# Patient Record
Sex: Male | Born: 1960 | Race: Black or African American | Hispanic: No | Marital: Single | Smoking: Current every day smoker
Health system: Southern US, Community
[De-identification: ages and names within clinical notes are randomized; demographics above are authoritative.]

## PROBLEM LIST (undated history)

## (undated) DIAGNOSIS — B1920 Unspecified viral hepatitis C without hepatic coma: Secondary | ICD-10-CM

## (undated) DIAGNOSIS — K219 Gastro-esophageal reflux disease without esophagitis: Secondary | ICD-10-CM

## (undated) DIAGNOSIS — F419 Anxiety disorder, unspecified: Secondary | ICD-10-CM

## (undated) DIAGNOSIS — F319 Bipolar disorder, unspecified: Secondary | ICD-10-CM

## (undated) DIAGNOSIS — F32A Depression, unspecified: Secondary | ICD-10-CM

## (undated) DIAGNOSIS — I10 Essential (primary) hypertension: Secondary | ICD-10-CM

## (undated) DIAGNOSIS — F209 Schizophrenia, unspecified: Secondary | ICD-10-CM

## (undated) DIAGNOSIS — F329 Major depressive disorder, single episode, unspecified: Secondary | ICD-10-CM

## (undated) DIAGNOSIS — M1990 Unspecified osteoarthritis, unspecified site: Secondary | ICD-10-CM

## (undated) HISTORY — PX: BACK SURGERY: SHX140

## (undated) HISTORY — PX: COLONOSCOPY: SHX174

---

## 2016-09-24 HISTORY — PX: JOINT REPLACEMENT: SHX530

## 2018-06-01 ENCOUNTER — Encounter (HOSPITAL_COMMUNITY): Payer: Self-pay | Admitting: Emergency Medicine

## 2018-06-01 ENCOUNTER — Emergency Department (HOSPITAL_COMMUNITY): Payer: Medicaid Other

## 2018-06-01 ENCOUNTER — Emergency Department (HOSPITAL_COMMUNITY)
Admission: EM | Admit: 2018-06-01 | Discharge: 2018-06-01 | Disposition: A | Discharge status: Home / Self Care | Payer: Medicaid Other | Attending: Emergency Medicine | Admitting: Emergency Medicine

## 2018-06-01 ENCOUNTER — Other Ambulatory Visit: Payer: Self-pay

## 2018-06-01 DIAGNOSIS — I10 Essential (primary) hypertension: Secondary | ICD-10-CM | POA: Insufficient documentation

## 2018-06-01 DIAGNOSIS — G8929 Other chronic pain: Secondary | ICD-10-CM | POA: Insufficient documentation

## 2018-06-01 DIAGNOSIS — M25562 Pain in left knee: Secondary | ICD-10-CM | POA: Insufficient documentation

## 2018-06-01 DIAGNOSIS — F17210 Nicotine dependence, cigarettes, uncomplicated: Secondary | ICD-10-CM | POA: Insufficient documentation

## 2018-06-01 HISTORY — DX: Depression, unspecified: F32.A

## 2018-06-01 HISTORY — DX: Essential (primary) hypertension: I10

## 2018-06-01 HISTORY — DX: Major depressive disorder, single episode, unspecified: F32.9

## 2018-06-01 LAB — CBC
HCT: 43.1 % (ref 39.0–52.0)
HEMOGLOBIN: 14.4 g/dL (ref 13.0–17.0)
MCH: 32.4 pg (ref 26.0–34.0)
MCHC: 33.4 g/dL (ref 30.0–36.0)
MCV: 96.9 fL (ref 78.0–100.0)
Platelets: 138 10*3/uL — ABNORMAL LOW (ref 150–400)
RBC: 4.45 MIL/uL (ref 4.22–5.81)
RDW: 13.3 % (ref 11.5–15.5)
WBC: 5.1 10*3/uL (ref 4.0–10.5)

## 2018-06-01 LAB — C-REACTIVE PROTEIN

## 2018-06-01 LAB — SEDIMENTATION RATE: Sed Rate: 8 mm/hr (ref 0–16)

## 2018-06-01 NOTE — Discharge Instructions (Signed)
It was my pleasure taking care of you today!   Fortunately, your blood work was normal today.  You should still call your orthopedic doctor and schedule a follow up appointment.  Return to ER for fever, new or worsening symptoms, any additional concerns.

## 2018-06-01 NOTE — ED Triage Notes (Signed)
Pt. Stated, I had knee surgery a year ago and its still not right. My Dr said I probably have something loose.

## 2018-06-01 NOTE — ED Provider Notes (Signed)
[facility] EMERGENCY DEPARTMENT Provider Note   CSN: [id number] Arrival date & time: 06/01/18  1191     History   Chief Complaint Chief Complaint  Patient presents with  . Knee Pain    HPI Donald Jacobson is a 57 y.o. male.  The history is provided by the patient and medical records. No language interpreter was used.  Knee Pain   Pertinent negatives include no numbness.   Donald Jacobson is a 57 y.o. male  with a PMH of HTN who presents to the Emergency Department complaining of persistent pain to his left knee ever since a total knee replacement about a year ago through the [place].  He states that the knee has been swollen and painful since surgery.  About 2 months ago, he also felt it was getting warm to the touch.  He saw his orthopedist about 1 month ago where x-ray was done and he was told to follow-up in about a week.  He has been unable to do so due to transportation difficulties.  He called the orthopedist 1 week ago and was told to come to the emergency department if he could not get transportation to the office.  No acute worsening in his pain over the last couple of weeks.  He denies any systemic symptoms including fever/chills.  Pain is worse with movement of the knee and ambulation.  Denies alleviating factors.  Past Medical History:  Diagnosis Date  . Depression   . Hypertension     There are no active problems to display for this patient.   History reviewed. No pertinent surgical history.      Home Medications    Prior to Admission medications   Not on File    Family History No family history on file.  Social History Social History   Tobacco Use  . Smoking status: Current Every Day Smoker  . Smokeless tobacco: Current User  Substance Use Topics  . Alcohol use: Yes  . Drug use: Yes    Types: Marijuana     Allergies   Patient has no allergy information on record.   Review of Systems Review of Systems  Constitutional:  Negative for chills and fever.  Musculoskeletal: Positive for arthralgias, joint swelling and myalgias.  Skin: Negative for color change.  Neurological: Negative for weakness and numbness.     Physical Exam Updated Vital Signs BP (!) 135/94 (BP Location: Right Arm)   Pulse 75   Temp 97.6 F (36.4 C) (Oral)   Resp 18   Ht 5\' 11"  (1.803 m)   Wt 83.5 kg   SpO2 100%   BMI 25.66 kg/m   Physical Exam  Constitutional: He appears well-developed and well-nourished. No distress.  HENT:  Head: Normocephalic and atraumatic.  Neck: Neck supple.  Cardiovascular: Normal rate, regular rhythm and normal heart sounds.  No murmur heard. Pulmonary/Chest: Effort normal and breath sounds normal. No respiratory distress. He has no wheezes. He has no rales.  Musculoskeletal:  Tenderness to palpation of medial and lateral aspects of the knee.  Decreased range of motion which patient states is baseline since knee replacement. + Swelling.  No erythema overlying the joint, however is slightly warm in comparison with the right.  Patient states that this warmth has been present for several months now. 2+ DP pulses bilaterally. All compartments are soft. Sensation intact distal to injury.  Neurological: He is alert.  Skin: Skin is warm and dry.  Nursing note and vitals reviewed.  ED Treatments / Results  Labs (all labs ordered are listed, but only abnormal results are displayed) Labs Reviewed  CBC - Abnormal; Notable for the following components:      Result Value   Platelets 138 (*)    All other components within normal limits  SEDIMENTATION RATE  C-REACTIVE PROTEIN    EKG None  Radiology Dg Knee Complete 4 Views Left  Result Date: 06/01/2018 CLINICAL DATA:  Chronic left knee pain EXAM: LEFT KNEE - COMPLETE 4+ VIEW COMPARISON:  None. FINDINGS: Total knee prosthesis observed with expected positioning of the prosthesis components and without fracture or evidence of loosening of components.  Palmar tray component appears appropriately located. There is some soft tissue swelling in the subcutaneous tissues along the anterior knee. Indistinctness of tissue planes along the suprapatellar bursa and in the expected vicinity of Hoffa's fat pad. IMPRESSION: 1. No specific abnormality associated with the prosthesis is identified. 2. However, there is indistinctness of tissue planes along the suprapatellar bursa and effacement of Hoffa's fat pad. I cannot exclude synovitis or joint effusion. There is also some subcutaneous edema anteriorly along the knee. Electronically Signed   By: Gaylyn Rong M.D.   On: 06/01/2018 09:19    Procedures Procedures (including critical care time)  Medications Ordered in ED Medications - No data to display   Initial Impression / Assessment and Plan / ED Course  I have reviewed the triage vital signs and the nursing notes.  Pertinent labs & imaging results that were available during my care of the patient were reviewed by me and considered in my medical decision making (see chart for details).    Donald Jacobson is a 57 y.o. male who presents to ED for persistent pain and swelling to the left knee since total knee replacement about a year ago through the [place].  He began having some warmth to the knee couple of months ago, therefore he sought the [place] orthopedist last month.  An x-ray was done and he was told to follow-up for further recommendations.  He has been unable to follow-up due to transportation issues.  Today, patient is afebrile, hemodynamically stable with normal inflammatory markers and white count.  He does have some warmth and swelling to the knee, but no erythema. Evaluation does not show pathology that would require ongoing emergent intervention or inpatient treatment.  Stressed the importance of following up with his orthopedist.  Reasons to return to ER were discussed.  All questions answered.  Patient discharged in satisfactory  condition.  Patient discussed with Dr. Jeraldine Loots who agrees with treatment plan.   Final Clinical Impressions(s) / ED Diagnoses   Final diagnoses:  Chronic pain of left knee    ED Discharge Orders    None       Donald Jacobson, Chase Picket, PA-C 06/01/18 1007    Gerhard Munch, MD 06/01/18 1558

## 2018-11-26 ENCOUNTER — Emergency Department (HOSPITAL_COMMUNITY): Payer: Medicaid Other

## 2018-11-26 ENCOUNTER — Emergency Department (HOSPITAL_COMMUNITY)
Admission: EM | Admit: 2018-11-26 | Discharge: 2018-11-26 | Disposition: A | Discharge status: Home / Self Care | Payer: Medicaid Other | Attending: Emergency Medicine | Admitting: Emergency Medicine

## 2018-11-26 ENCOUNTER — Other Ambulatory Visit: Payer: Self-pay

## 2018-11-26 ENCOUNTER — Encounter (HOSPITAL_COMMUNITY): Payer: Self-pay | Admitting: *Deleted

## 2018-11-26 DIAGNOSIS — Y998 Other external cause status: Secondary | ICD-10-CM | POA: Insufficient documentation

## 2018-11-26 DIAGNOSIS — Y939 Activity, unspecified: Secondary | ICD-10-CM | POA: Diagnosis not present

## 2018-11-26 DIAGNOSIS — W01198A Fall on same level from slipping, tripping and stumbling with subsequent striking against other object, initial encounter: Secondary | ICD-10-CM | POA: Diagnosis not present

## 2018-11-26 DIAGNOSIS — I10 Essential (primary) hypertension: Secondary | ICD-10-CM | POA: Diagnosis not present

## 2018-11-26 DIAGNOSIS — S62327A Displaced fracture of shaft of fifth metacarpal bone, left hand, initial encounter for closed fracture: Secondary | ICD-10-CM | POA: Insufficient documentation

## 2018-11-26 DIAGNOSIS — Y929 Unspecified place or not applicable: Secondary | ICD-10-CM | POA: Insufficient documentation

## 2018-11-26 DIAGNOSIS — F17290 Nicotine dependence, other tobacco product, uncomplicated: Secondary | ICD-10-CM | POA: Insufficient documentation

## 2018-11-26 DIAGNOSIS — S6992XA Unspecified injury of left wrist, hand and finger(s), initial encounter: Secondary | ICD-10-CM | POA: Diagnosis present

## 2018-11-26 MED ORDER — HYDROCODONE-ACETAMINOPHEN 5-325 MG PO TABS: 1.0000 | ORAL_TABLET | ORAL | 0 refills | Status: DC | PRN

## 2018-11-26 NOTE — ED Triage Notes (Signed)
Pt in with swelling to the L hand after hitting a wall last night, able to move all fingers, skin intact, A&o x4

## 2018-11-26 NOTE — Discharge Instructions (Addendum)
Take Norco as needed as prescribed for pain. Elevate your hand and apply ice to area for 30 minutes three times daily.

## 2018-11-26 NOTE — ED Provider Notes (Signed)
[facility] EMERGENCY DEPARTMENT Provider Note   CSN: [id number] Arrival date & time: 11/26/18  6962    History   Chief Complaint Chief Complaint  Patient presents with  . Hand Pain    HPI Donald Jacobson is a 58 y.o. male.     58 year old male presents with complaint of left hand injury.  Patient states that he fell yesterday causing him to hit his hand on his fence.  Patient is right-hand dominant, denies any open wounds or other injuries.     Past Medical History:  Diagnosis Date  . Depression   . Hypertension     There are no active problems to display for this patient.   History reviewed. No pertinent surgical history.      Home Medications    Prior to Admission medications   Medication Sig Start Date End Date Taking? Authorizing Provider  HYDROcodone-acetaminophen (NORCO/VICODIN) 5-325 MG tablet Take 1 tablet by mouth every 4 (four) hours as needed for moderate pain. 11/26/18   Jeannie Fend, PA-C    Family History No family history on file.  Social History Social History   Tobacco Use  . Smoking status: Current Every Day Smoker    Packs/day: 2.00    Types: Cigars  . Smokeless tobacco: Current User  Substance Use Topics  . Alcohol use: Yes  . Drug use: Yes    Types: Marijuana     Allergies   Patient has no known allergies.   Review of Systems Review of Systems  Constitutional: Negative for chills and fever.  Musculoskeletal: Positive for arthralgias, joint swelling and myalgias.  Skin: Negative for color change, rash and wound.  Allergic/Immunologic: Negative for immunocompromised state.  Neurological: Negative for weakness and numbness.  Hematological: Does not bruise/bleed easily.  Psychiatric/Behavioral: Negative for self-injury.     Physical Exam Updated Vital Signs BP 134/82 (BP Location: Right Arm)   Pulse 73   Temp 98.9 F (37.2 C) (Oral)   Resp 20   Ht 5\' 11"  (1.803 m)   Wt 88.5 kg   SpO2 99%    BMI 27.20 kg/m   Physical Exam Vitals signs and nursing note reviewed.  Constitutional:      General: He is not in acute distress.    Appearance: He is well-developed. He is not diaphoretic.  HENT:     Head: Normocephalic and atraumatic.  Cardiovascular:     Pulses: Normal pulses.  Pulmonary:     Effort: Pulmonary effort is normal.  Musculoskeletal:        General: Swelling, tenderness and signs of injury present.       Hands:  Skin:    General: Skin is warm and dry.     Findings: No erythema.  Neurological:     Mental Status: He is alert and oriented to person, place, and time.  Psychiatric:        Behavior: Behavior normal.      ED Treatments / Results  Labs (all labs ordered are listed, but only abnormal results are displayed) Labs Reviewed - No data to display  EKG None  Radiology Dg Hand Complete Left  Result Date: 11/26/2018 CLINICAL DATA:  Posttraumatic left hand swelling EXAM: LEFT HAND - COMPLETE 3+ VIEW COMPARISON:  None. FINDINGS: Oblique fracture through the fifth metacarpal shaft with spiraling. There is up to 100% displacement seen on the oblique view. No dislocation. Soft tissue swelling. Diffuse interphalangeal degenerative narrowing and spurring. Generalized osteopenic appearance. Scapholunate widening of indeterminate chronicity  IMPRESSION: 1. Displaced spiral fracture of the fifth metacarpal shaft. 2. Age-indeterminate widening of the scapholunate interval. Electronically Signed   By: Marnee Spring M.D.   On: 11/26/2018 10:37    Procedures Procedures (including critical care time)  Medications Ordered in ED Medications - No data to display   Initial Impression / Assessment and Plan / ED Course  I have reviewed the triage vital signs and the nursing notes.  Pertinent labs & imaging results that were available during my care of the patient were reviewed by me and considered in my medical decision making (see chart for details).  Clinical Course  as of Nov 26 1142  Wed Nov 26, 2018  [id number] 58 year old male, right-hand-dominant, mechanical fall resulting in his left hand hitting a fence yesterday.  Skin is intact, neurovascular intact, reports pain and swelling along the fourth and fifth metacarpals.  Patient is slight ulnar deviation of the left fifth finger.  X-ray shows left fifth metacarpal fracture.  Case discussed with Earney Hamburg, PA-C, on-call with orthopedics who has seen the patient and recommends ulnar gutter splint with follow-up with Dr. Izora Ribas.  Patient given prescription for Norco for pain, advised to ice and elevate.   [LM]    Clinical Course User Index [LM] Jeannie Fend, PA-C    Final Clinical Impressions(s) / ED Diagnoses   Final diagnoses:  Closed displaced fracture of shaft of fifth metacarpal bone of left hand, initial encounter    ED Discharge Orders         Ordered    HYDROcodone-acetaminophen (NORCO/VICODIN) 5-325 MG tablet  Every 4 hours PRN     11/26/18 1127           Jeannie Fend, PA-C 11/26/18 1144    Pricilla Loveless, MD 12/08/18 [id number]

## 2018-11-26 NOTE — Progress Notes (Signed)
Orthopedic Tech Progress Note Patient Details:  Donald Jacobson 1961-03-26 [id number]  Ortho Devices Type of Ortho Device: Ulna gutter splint Ortho Device/Splint Location: left Ortho Device/Splint Interventions: Adjustment, Application, Ordered   Post Interventions Patient Tolerated: Well Instructions Provided: Care of device, Adjustment of device   Donald Jacobson J [name] 11/26/2018, 12:11 PM

## 2018-11-26 NOTE — Consult Note (Signed)
Reason for Consult:Left hand fx Referring Physician: [name] is an 58 y.o. male.  HPI: Donald Jacobson was climbing his fence to get to the store behind his house when he fell. He put out his hands to break his fall and had immediate left hand pain. He put up with it overnight but when it was still hurting and quite swollen this morning he came to the ED for evaluation. X-rays showed a 5th [facility] fx and hand surgery was consulted. He is RHD.  Past Medical History:  Diagnosis Date  . Depression   . Hypertension     History reviewed. No pertinent surgical history.  No family history on file.  Social History:  reports that he has been smoking cigars. He has been smoking about 2.00 packs per day. He uses smokeless tobacco. He reports current alcohol use. He reports current drug use. Drug: Marijuana.  Allergies: No Known Allergies  Medications: I have reviewed the patient's current medications.  No results found for this or any previous visit (from the past 48 hour(s)).  Dg Hand Complete Left  Result Date: 11/26/2018 CLINICAL DATA:  Posttraumatic left hand swelling EXAM: LEFT HAND - COMPLETE 3+ VIEW COMPARISON:  None. FINDINGS: Oblique fracture through the fifth metacarpal shaft with spiraling. There is up to 100% displacement seen on the oblique view. No dislocation. Soft tissue swelling. Diffuse interphalangeal degenerative narrowing and spurring. Generalized osteopenic appearance. Scapholunate widening of indeterminate chronicity IMPRESSION: 1. Displaced spiral fracture of the fifth metacarpal shaft. 2. Age-indeterminate widening of the scapholunate interval. Electronically Signed   By: Marnee Spring M.D.   On: 11/26/2018 10:37    Review of Systems  Constitutional: Negative for weight loss.  HENT: Negative for ear discharge, ear pain, hearing loss and tinnitus.   Eyes: Negative for blurred vision, double vision, photophobia and pain.  Respiratory: Negative for cough, sputum  production and shortness of breath.   Cardiovascular: Negative for chest pain.  Gastrointestinal: Negative for abdominal pain, nausea and vomiting.  Genitourinary: Negative for dysuria, flank pain, frequency and urgency.  Musculoskeletal: Positive for joint pain (Left hand). Negative for back pain, falls, myalgias and neck pain.  Neurological: Negative for dizziness, tingling, sensory change, focal weakness, loss of consciousness and headaches.  Endo/Heme/Allergies: Does not bruise/bleed easily.  Psychiatric/Behavioral: Negative for depression, memory loss and substance abuse. The patient is not nervous/anxious.    Blood pressure 134/82, pulse 73, temperature 98.9 F (37.2 C), temperature source Oral, resp. rate 20, height 5\' 11"  (1.803 m), weight 88.5 kg, SpO2 99 %. Physical Exam  Constitutional: He appears well-developed and well-nourished. No distress.  HENT:  Head: Normocephalic and atraumatic.  Eyes: Conjunctivae are normal. Right eye exhibits no discharge. Left eye exhibits no discharge. No scleral icterus.  Neck: Normal range of motion.  Cardiovascular: Normal rate and regular rhythm.  Respiratory: Effort normal. No respiratory distress.  Musculoskeletal:     Comments: Left shoulder, elbow, wrist, digits- no skin wounds, mod TTP ulnar aspect, mild ecchymosis palm, mod dorsal edema, no instability, no blocks to motion  Sens  Ax/R/M/U intact  Mot   Ax/ R/ PIN/ M/ AIN/ U intact  Rad 2+  Neurological: He is alert.  Skin: Skin is warm and dry. He is not diaphoretic.  Psychiatric: He has a normal mood and affect. His behavior is normal.    Assessment/Plan: Left 5th [facility] fx -- Ulnar gutter splint and f/u with Dr. Izora Ribas in the office in ~1 week.    Nolon Bussing.  [facility] Glass[facility] [phone] 11/26/2018, 11:53 AM

## 2018-12-01 ENCOUNTER — Ambulatory Visit: Payer: Self-pay | Admitting: Orthopedic Surgery
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
## 2018-12-05 ENCOUNTER — Other Ambulatory Visit: Payer: Self-pay

## 2018-12-05 ENCOUNTER — Encounter (HOSPITAL_COMMUNITY): Payer: Self-pay | Admitting: *Deleted

## 2018-12-05 NOTE — Progress Notes (Signed)
Spoke with pt for pre-op call. Pt denies cardiac history and diabetes.  

## 2018-12-06 ENCOUNTER — Other Ambulatory Visit: Payer: Self-pay | Admitting: Orthopaedic Surgery

## 2018-12-06 ENCOUNTER — Ambulatory Visit: Payer: Self-pay | Admitting: Orthopaedic Surgery

## 2018-12-07 ENCOUNTER — Encounter (HOSPITAL_COMMUNITY): Payer: Self-pay | Admitting: Anesthesiology

## 2018-12-07 NOTE — Anesthesia Preprocedure Evaluation (Addendum)
Anesthesia Evaluation  Patient identified by MRN, date of birth, ID band Patient awake    Reviewed: Allergy & Precautions, NPO status , Patient's Chart, lab work & pertinent test results  Airway Mallampati: I  TM Distance: >3 FB Neck ROM: Full    Dental no notable dental hx. (+) Teeth Intact   Pulmonary Current Smoker,    Pulmonary exam normal breath sounds clear to auscultation       Cardiovascular hypertension, Pt. on medications Normal cardiovascular exam Rhythm:Regular Rate:Normal     Neuro/Psych PSYCHIATRIC DISORDERS Anxiety Depression Bipolar Disorder Schizophrenia negative neurological ROS     GI/Hepatic GERD  Medicated and Controlled,(+)     substance abuse  alcohol use and marijuana use, Hepatitis -, CNon compliant with Rx   Endo/Other  negative endocrine ROS  Renal/GU negative Renal ROS  negative genitourinary   Musculoskeletal  (+) Arthritis , Osteoarthritis,  Left 5th Metacarpal Fx   Abdominal   Peds  Hematology negative hematology ROS (+)   Anesthesia Other Findings   Reproductive/Obstetrics                            Anesthesia Physical Anesthesia Plan  ASA: III  Anesthesia Plan: General   Post-op Pain Management:    Induction: Intravenous  PONV Risk Score and Plan: 2 and Ondansetron, Treatment may vary due to age or medical condition and Midazolam  Airway Management Planned: LMA  Additional Equipment:   Intra-op Plan:   Post-operative Plan: Extubation in OR  Informed Consent: I have reviewed the patients History and Physical, chart, labs and discussed the procedure including the risks, benefits and alternatives for the proposed anesthesia with the patient or authorized representative who has indicated his/her understanding and acceptance.     Dental advisory given  Plan Discussed with: CRNA and Surgeon  Anesthesia Plan Comments:        Anesthesia  Quick Evaluation

## 2018-12-08 ENCOUNTER — Ambulatory Visit (HOSPITAL_COMMUNITY): Payer: Medicaid Other | Admitting: Anesthesiology

## 2018-12-08 ENCOUNTER — Other Ambulatory Visit: Payer: Self-pay

## 2018-12-08 ENCOUNTER — Ambulatory Visit (HOSPITAL_COMMUNITY)
Admission: RE | Admit: 2018-12-08 | Discharge: 2018-12-08 | Disposition: A | Discharge status: Home / Self Care | Payer: Medicaid Other | Attending: Orthopaedic Surgery | Admitting: Orthopaedic Surgery

## 2018-12-08 ENCOUNTER — Encounter (HOSPITAL_COMMUNITY)
Admission: RE | Disposition: A | Discharge status: Home / Self Care | Payer: Self-pay | Source: Home / Self Care | Attending: Orthopaedic Surgery

## 2018-12-08 ENCOUNTER — Encounter (HOSPITAL_COMMUNITY): Payer: Self-pay | Admitting: Surgery

## 2018-12-08 DIAGNOSIS — Z79899 Other long term (current) drug therapy: Secondary | ICD-10-CM | POA: Insufficient documentation

## 2018-12-08 DIAGNOSIS — F209 Schizophrenia, unspecified: Secondary | ICD-10-CM | POA: Insufficient documentation

## 2018-12-08 DIAGNOSIS — B1920 Unspecified viral hepatitis C without hepatic coma: Secondary | ICD-10-CM | POA: Insufficient documentation

## 2018-12-08 DIAGNOSIS — M1990 Unspecified osteoarthritis, unspecified site: Secondary | ICD-10-CM | POA: Diagnosis not present

## 2018-12-08 DIAGNOSIS — S62327A Displaced fracture of shaft of fifth metacarpal bone, left hand, initial encounter for closed fracture: Secondary | ICD-10-CM | POA: Diagnosis not present

## 2018-12-08 DIAGNOSIS — W19XXXA Unspecified fall, initial encounter: Secondary | ICD-10-CM | POA: Diagnosis not present

## 2018-12-08 DIAGNOSIS — Z91041 Radiographic dye allergy status: Secondary | ICD-10-CM | POA: Diagnosis not present

## 2018-12-08 DIAGNOSIS — K219 Gastro-esophageal reflux disease without esophagitis: Secondary | ICD-10-CM | POA: Diagnosis not present

## 2018-12-08 DIAGNOSIS — F319 Bipolar disorder, unspecified: Secondary | ICD-10-CM | POA: Diagnosis not present

## 2018-12-08 DIAGNOSIS — F419 Anxiety disorder, unspecified: Secondary | ICD-10-CM | POA: Insufficient documentation

## 2018-12-08 DIAGNOSIS — F17290 Nicotine dependence, other tobacco product, uncomplicated: Secondary | ICD-10-CM | POA: Diagnosis not present

## 2018-12-08 DIAGNOSIS — I10 Essential (primary) hypertension: Secondary | ICD-10-CM | POA: Diagnosis not present

## 2018-12-08 HISTORY — DX: Bipolar disorder, unspecified: F31.9

## 2018-12-08 HISTORY — DX: Schizophrenia, unspecified: F20.9

## 2018-12-08 HISTORY — DX: Unspecified osteoarthritis, unspecified site: M19.90

## 2018-12-08 HISTORY — DX: Anxiety disorder, unspecified: F41.9

## 2018-12-08 HISTORY — DX: Gastro-esophageal reflux disease without esophagitis: K21.9

## 2018-12-08 HISTORY — DX: Unspecified viral hepatitis C without hepatic coma: B19.20

## 2018-12-08 HISTORY — PX: OPEN REDUCTION INTERNAL FIXATION (ORIF) METACARPAL: SHX6234

## 2018-12-08 LAB — COMPREHENSIVE METABOLIC PANEL
ALT: 30 U/L (ref 0–44)
AST: 24 U/L (ref 15–41)
Albumin: 3.7 g/dL (ref 3.5–5.0)
Alkaline Phosphatase: 91 U/L (ref 38–126)
Anion gap: 6 (ref 5–15)
BUN: 10 mg/dL (ref 6–20)
CALCIUM: 9.2 mg/dL (ref 8.9–10.3)
CO2: 24 mmol/L (ref 22–32)
CREATININE: 0.94 mg/dL (ref 0.61–1.24)
Chloride: 108 mmol/L (ref 98–111)
GFR calc Af Amer: >60 mL/min (ref >60)
Glucose, Bld: 88 mg/dL (ref 70–99)
Potassium: 3.5 mmol/L (ref 3.5–5.1)
Sodium: 138 mmol/L (ref 135–145)
Total Bilirubin: 1.4 mg/dL — ABNORMAL HIGH (ref 0.3–1.2)
Total Protein: 6.8 g/dL (ref 6.5–8.1)

## 2018-12-08 LAB — CBC
HCT: 39.5 % (ref 39.0–52.0)
Hemoglobin: 13.5 g/dL (ref 13.0–17.0)
MCH: 32.2 pg (ref 26.0–34.0)
MCHC: 34.2 g/dL (ref 30.0–36.0)
MCV: 94.3 fL (ref 80.0–100.0)
PLATELETS: 140 10*3/uL — AB (ref 150–400)
RBC: 4.19 MIL/uL — ABNORMAL LOW (ref 4.22–5.81)
RDW: 13.5 % (ref 11.5–15.5)
WBC: 5.8 10*3/uL (ref 4.0–10.5)
nRBC: 0 % (ref 0.0–0.2)

## 2018-12-08 SURGERY — OPEN REDUCTION INTERNAL FIXATION (ORIF) METACARPAL
Anesthesia: General | Laterality: Left

## 2018-12-08 MED ORDER — LIDOCAINE 2% (20 MG/ML) 5 ML SYRINGE
INTRAMUSCULAR | Status: AC
  Filled 2018-12-08: qty 5

## 2018-12-08 MED ORDER — MIDAZOLAM HCL 2 MG/2ML IJ SOLN
INTRAMUSCULAR | Status: DC | PRN
  Administered 2018-12-08: 2 mg via INTRAVENOUS

## 2018-12-08 MED ORDER — ONDANSETRON HCL 4 MG/2ML IJ SOLN
INTRAMUSCULAR | Status: DC | PRN
  Administered 2018-12-08: 4 mg via INTRAVENOUS

## 2018-12-08 MED ORDER — MEPERIDINE HCL 50 MG/ML IJ SOLN: 6.2500 mg | INTRAMUSCULAR | Status: DC | PRN

## 2018-12-08 MED ORDER — 0.9 % SODIUM CHLORIDE (POUR BTL) OPTIME
TOPICAL | Status: DC | PRN
  Administered 2018-12-08: 1000 mL

## 2018-12-08 MED ORDER — HYDROCODONE-ACETAMINOPHEN 7.5-325 MG PO TABS
ORAL_TABLET | ORAL | Status: AC
  Filled 2018-12-08: qty 1

## 2018-12-08 MED ORDER — HYDROCODONE-ACETAMINOPHEN 7.5-325 MG PO TABS
1.0000 | ORAL_TABLET | Freq: Once | ORAL | Status: AC | PRN
  Administered 2018-12-08: 1 via ORAL

## 2018-12-08 MED ORDER — HYDROCODONE-ACETAMINOPHEN 5-325 MG PO TABS: 1.0000 | ORAL_TABLET | ORAL | 0 refills | Status: AC | PRN

## 2018-12-08 MED ORDER — FENTANYL CITRATE (PF) 100 MCG/2ML IJ SOLN
INTRAMUSCULAR | Status: DC | PRN
  Administered 2018-12-08 (×2): 25 ug via INTRAVENOUS
  Administered 2018-12-08: 100 ug via INTRAVENOUS

## 2018-12-08 MED ORDER — ACETAMINOPHEN 10 MG/ML IV SOLN
INTRAVENOUS | Status: DC | PRN
  Administered 2018-12-08: 1000 mg via INTRAVENOUS

## 2018-12-08 MED ORDER — KETOROLAC TROMETHAMINE 30 MG/ML IJ SOLN
INTRAMUSCULAR | Status: DC | PRN
  Administered 2018-12-08: 30 mg via INTRAVENOUS

## 2018-12-08 MED ORDER — PROPOFOL 10 MG/ML IV BOLUS
INTRAVENOUS | Status: DC | PRN
  Administered 2018-12-08: 200 mg via INTRAVENOUS

## 2018-12-08 MED ORDER — ONDANSETRON HCL 4 MG/2ML IJ SOLN: 4.0000 mg | Freq: Once | INTRAMUSCULAR | Status: DC | PRN

## 2018-12-08 MED ORDER — FENTANYL CITRATE (PF) 250 MCG/5ML IJ SOLN
INTRAMUSCULAR | Status: AC
  Filled 2018-12-08: qty 5

## 2018-12-08 MED ORDER — LACTATED RINGERS IV SOLN
INTRAVENOUS | Status: DC | PRN
  Administered 2018-12-08: 07:00:00 via INTRAVENOUS

## 2018-12-08 MED ORDER — BUPIVACAINE HCL (PF) 0.25 % IJ SOLN
INTRAMUSCULAR | Status: AC
  Filled 2018-12-08: qty 30

## 2018-12-08 MED ORDER — PROPOFOL 10 MG/ML IV BOLUS
INTRAVENOUS | Status: AC
  Filled 2018-12-08: qty 20

## 2018-12-08 MED ORDER — DEXAMETHASONE SODIUM PHOSPHATE 10 MG/ML IJ SOLN
INTRAMUSCULAR | Status: AC
  Filled 2018-12-08: qty 1

## 2018-12-08 MED ORDER — KETOROLAC TROMETHAMINE 30 MG/ML IJ SOLN
INTRAMUSCULAR | Status: AC
  Filled 2018-12-08: qty 1

## 2018-12-08 MED ORDER — MIDAZOLAM HCL 2 MG/2ML IJ SOLN
INTRAMUSCULAR | Status: AC
  Filled 2018-12-08: qty 2

## 2018-12-08 MED ORDER — FENTANYL CITRATE (PF) 100 MCG/2ML IJ SOLN
25.0000 ug | INTRAMUSCULAR | Status: DC | PRN
  Administered 2018-12-08 (×2): 50 ug via INTRAVENOUS

## 2018-12-08 MED ORDER — ACETAMINOPHEN 10 MG/ML IV SOLN
INTRAVENOUS | Status: AC
  Filled 2018-12-08: qty 100

## 2018-12-08 MED ORDER — CHLORHEXIDINE GLUCONATE 4 % EX LIQD: 60.0000 mL | Freq: Once | CUTANEOUS | Status: DC

## 2018-12-08 MED ORDER — LIDOCAINE 2% (20 MG/ML) 5 ML SYRINGE
INTRAMUSCULAR | Status: DC | PRN
  Administered 2018-12-08: 80 mg via INTRAVENOUS

## 2018-12-08 MED ORDER — FENTANYL CITRATE (PF) 100 MCG/2ML IJ SOLN
INTRAMUSCULAR | Status: AC
  Filled 2018-12-08: qty 2

## 2018-12-08 MED ORDER — BUPIVACAINE HCL (PF) 0.25 % IJ SOLN
INTRAMUSCULAR | Status: DC | PRN
  Administered 2018-12-08: 10 mL

## 2018-12-08 MED ORDER — DEXAMETHASONE SODIUM PHOSPHATE 10 MG/ML IJ SOLN
INTRAMUSCULAR | Status: DC | PRN
  Administered 2018-12-08: 5 mg via INTRAVENOUS

## 2018-12-08 MED ORDER — ONDANSETRON HCL 4 MG/2ML IJ SOLN
INTRAMUSCULAR | Status: AC
  Filled 2018-12-08: qty 2

## 2018-12-08 MED ORDER — CEFAZOLIN SODIUM-DEXTROSE 2-4 GM/100ML-% IV SOLN
2.0000 g | INTRAVENOUS | Status: AC
  Administered 2018-12-08: 2 g via INTRAVENOUS
  Filled 2018-12-08: qty 100

## 2018-12-08 SURGICAL SUPPLY — 57 items
BANDAGE ACE 3X5.8 VEL STRL LF (GAUZE/BANDAGES/DRESSINGS) ×3 IMPLANT
BANDAGE ACE 4X5 VEL STRL LF (GAUZE/BANDAGES/DRESSINGS) ×3 IMPLANT
BANDAGE ELASTIC 3 VELCRO ST LF (GAUZE/BANDAGES/DRESSINGS) ×3 IMPLANT
BANDAGE ELASTIC 4 VELCRO ST LF (GAUZE/BANDAGES/DRESSINGS) ×3 IMPLANT
BIT DRILL 1.5 (BIT) ×1
BIT DRILL 1.5MM (BIT) ×1 IMPLANT
BLADE CLIPPER SURG (BLADE) IMPLANT
BNDG ESMARK 4X9 LF (GAUZE/BANDAGES/DRESSINGS) ×3 IMPLANT
BNDG GAUZE ELAST 4 BULKY (GAUZE/BANDAGES/DRESSINGS) ×3 IMPLANT
CORDS BIPOLAR (ELECTRODE) ×3 IMPLANT
COVER SURGICAL LIGHT HANDLE (MISCELLANEOUS) ×3 IMPLANT
COVER WAND RF STERILE (DRAPES) ×3 IMPLANT
CUFF TOURNIQUET SINGLE 18IN (TOURNIQUET CUFF) ×3 IMPLANT
CUFF TOURNIQUET SINGLE 24IN (TOURNIQUET CUFF) IMPLANT
DRAIN TLS ROUND 10FR (DRAIN) IMPLANT
DRAPE OEC MINIVIEW 54X84 (DRAPES) IMPLANT
DRAPE SURG 17X23 STRL (DRAPES) ×3 IMPLANT
DRILL BIT 1.5MM (BIT) ×2
GAUZE SPONGE 4X4 12PLY STRL (GAUZE/BANDAGES/DRESSINGS) ×3 IMPLANT
GAUZE XEROFORM 1X8 LF (GAUZE/BANDAGES/DRESSINGS) ×3 IMPLANT
GLOVE BIOGEL M 8.0 STRL (GLOVE) ×3 IMPLANT
GLOVE SS BIOGEL STRL SZ 8 (GLOVE) ×1 IMPLANT
GLOVE SUPERSENSE BIOGEL SZ 8 (GLOVE) ×2
GOWN STRL REUS W/ TWL LRG LVL3 (GOWN DISPOSABLE) ×3 IMPLANT
GOWN STRL REUS W/ TWL XL LVL3 (GOWN DISPOSABLE) ×3 IMPLANT
GOWN STRL REUS W/TWL LRG LVL3 (GOWN DISPOSABLE) ×6
GOWN STRL REUS W/TWL XL LVL3 (GOWN DISPOSABLE) ×6
KIT BASIN OR (CUSTOM PROCEDURE TRAY) ×3 IMPLANT
KIT TURNOVER KIT B (KITS) ×3 IMPLANT
MANIFOLD NEPTUNE II (INSTRUMENTS) ×3 IMPLANT
NEEDLE 22X1 1/2 (OR ONLY) (NEEDLE) IMPLANT
NS IRRIG 1000ML POUR BTL (IV SOLUTION) ×3 IMPLANT
PACK ORTHO EXTREMITY (CUSTOM PROCEDURE TRAY) ×3 IMPLANT
PAD ARMBOARD 7.5X6 YLW CONV (MISCELLANEOUS) ×6 IMPLANT
PAD CAST 3X4 CTTN HI CHSV (CAST SUPPLIES) ×1 IMPLANT
PAD CAST 4YDX4 CTTN HI CHSV (CAST SUPPLIES) ×1 IMPLANT
PADDING CAST COTTON 3X4 STRL (CAST SUPPLIES) ×2
PADDING CAST COTTON 4X4 STRL (CAST SUPPLIES) ×2
PLATE STR 6 35MM (Plate) ×3 IMPLANT
SCREW CORT TI ST 2.0X10 (Screw) ×6 IMPLANT
SCREW CORT TI ST 2.0X11 (Screw) ×6 IMPLANT
SCREW CORT TI ST 2.0X9 (Screw) ×6 IMPLANT
SCRUB BETADINE 4OZ XXX (MISCELLANEOUS) ×3 IMPLANT
SOL PREP POV-IOD 4OZ 10% (MISCELLANEOUS) ×3 IMPLANT
SPONGE LAP 4X18 RFD (DISPOSABLE) IMPLANT
SUT MNCRL AB 4-0 PS2 18 (SUTURE) ×3 IMPLANT
SUT PROLENE 3 0 PS 2 (SUTURE) IMPLANT
SUT PROLENE 4 0 PS 2 18 (SUTURE) ×3 IMPLANT
SUT VIC AB 3-0 FS2 27 (SUTURE) IMPLANT
SYR CONTROL 10ML LL (SYRINGE) IMPLANT
SYSTEM CHEST DRAIN TLS 7FR (DRAIN) IMPLANT
TOWEL OR 17X24 6PK STRL BLUE (TOWEL DISPOSABLE) ×3 IMPLANT
TOWEL OR 17X26 10 PK STRL BLUE (TOWEL DISPOSABLE) ×3 IMPLANT
TUBE CONNECTING 12'X1/4 (SUCTIONS) ×1
TUBE CONNECTING 12X1/4 (SUCTIONS) ×2 IMPLANT
TUBE EVACUATION TLS (MISCELLANEOUS) ×3 IMPLANT
WATER STERILE IRR 1000ML POUR (IV SOLUTION) ×3 IMPLANT

## 2018-12-08 NOTE — Anesthesia Postprocedure Evaluation (Signed)
Anesthesia Post Note  Patient: Donald Jacobson  Procedure(s) Performed: Left hand open reduction and internal fixation and repair as indicated (Left )     Patient location during evaluation: PACU Anesthesia Type: General Level of consciousness: awake and alert and oriented Pain management: pain level controlled Vital Signs Assessment: post-procedure vital signs reviewed and stable Respiratory status: spontaneous breathing, nonlabored ventilation and respiratory function stable Cardiovascular status: blood pressure returned to baseline and stable Postop Assessment: no apparent nausea or vomiting Anesthetic complications: no    Last Vitals:  Vitals:   12/08/18 0920 12/08/18 0935  BP: (!) 144/99 (!) 158/95  Pulse: (!) 52 (!) 54  Resp: 13 13  Temp:  36.5 C  SpO2: 95% 94%    Last Pain:  Vitals:   12/08/18 0935  TempSrc:   PainSc: 2                  Darielle Hancher A.

## 2018-12-08 NOTE — Anesthesia Procedure Notes (Signed)
Procedure Name: LMA Insertion Date/Time: 12/08/2018 7:38 AM Performed by: Laruth Bouchard., CRNA Pre-anesthesia Checklist: Patient identified, Emergency Drugs available, Suction available, Patient being monitored and Timeout performed Patient Re-evaluated:Patient Re-evaluated prior to induction Oxygen Delivery Method: Circle system utilized Preoxygenation: Pre-oxygenation with 100% oxygen Induction Type: IV induction Ventilation: Mask ventilation without difficulty LMA: LMA inserted LMA Size: 4.0 Number of attempts: 1 Placement Confirmation: positive ETCO2 and breath sounds checked- equal and bilateral Tube secured with: Tape Dental Injury: Teeth and Oropharynx as per pre-operative assessment

## 2018-12-08 NOTE — Discharge Instructions (Signed)
Keep dressings intact. Do not remove the splint The splint must stay clean and dry Keel the arm elevated above the level of the heart to prevent swelling and pain Move all fingers not restricted by the splint at least 4-5 times per day Take all pain medication as prescribed. Transition to over the counter medication (tylenol, ibuprofen, etc) as your pain improves Please call clinic in the next 1-2 days to schedule a follow up appointment with Dr. Roney Mans in 10-14 days.

## 2018-12-08 NOTE — Op Note (Signed)
PREOPERATIVE DIAGNOSIS: Displaced left fifth metacarpal fracture  POSTOPERATIVE DIAGNOSIS: Displaced left fifth metacarpal fracture  ATTENDING PHYSICIAN: Gasper Lloyd. Roney Mans, III, MD who was present and scrubbed for the entire case   ASSISTANT SURGEON: None.   SURGICAL PROCEDURES: 1.  Open reduction internal fixation left fifth metacarpal shaft fracture  SURGICAL INDICATIONS: Patient is a 58 year old male who had a fall onto the left hand.  He was seen in my clinic last week where he was found to have a displaced, shortened left fifth metacarpal shaft fracture.  He had significant defect in range of motion of his hand secondary to his shortened fracture. I discussed treatment options for him and after discussing the risk and benefits of surgery he did agree to proceed.  DESCRIPTION OF PROCEDURE: Patient was identified in the preoperative holding area where the risks, benefits and alternatives of the surgery were discussed.  These include but are not limited to infection, bleeding, damage to surrounding structures including blood vessels and nerves, stiffness, pain, malunion, nonunion and need for additional procedures. Informed consent was obtained at that time.  Patient's left hand was marked with a surgical marking pen.  He is then brought back to operative suite where a timeout was performed identifying the correct patient operative site.  Positioned supine on the operative table and induced under general LMA anesthesia.  His hand was outstretched on a hand table.  A tourniquet is placed on his upper arm and the arm was then prepped and draped in usual sterile fashion  The arm was exsanguinated and tourniquet was inflated.  A longitudinal incision was made over the dorsal aspect of the left hand overlying the fifth metacarpal.  Blunt dissection was carried down through the subcutaneous tissue taking care to protect the extensor nerves and visualized dorsal sensory branches.  The fifth metacarpal was  then visualized and mobilized both proximal distal aspects of the fracture site.  There was early callus formation which was excised with a rongure.  Bone-holding clamps were then utilized to reduce the fracture and hold it in place.  Fluoroscopic images were obtained to ensure anatomic alignment.  A 6 hole Synthes 2.0 titanium plate was then placed.  Single lag screw was placed through the plate across the fracture site.  A second lag screw was then placed outside of the plate achieving excellent interfragment compression.  2 proximal and 2 distal cortical screws were then placed through the plate with excellent purchase.  Fluoroscopic images were obtained to ensure appropriate screw length and stable alignment of the fracture.  Wound was then thoroughly irrigated with normal saline and the skin was closed with interrupted 4-0 Prolene's.  Xeroform 4 x 4's and a well-padded P1 blocking splint were placed. The tourniquet was released and the patient was awoken from his anesthesia.  He was taken to the PACU in stable condition.  There were no complications.  All counts were correct.  RADIOGRAPHS: 2 views of the left hand were obtained postoperatively using fluoroscopy.  These show anatomic alignment of the fifth metacarpal fracture with appropriate screw positioning and length.  ESTIMATED BLOOD LOSS: Minimal  TOURNIQUET TIME: Less than 45 minutes.  POSTOPERATIVE PLAN: The patient will be discharged home and seen back  in the office in approximately 10-14 days for wound check, suture  removal, and then be sent to a therapist for early range of motion per metacarpal ORIF protocol.

## 2018-12-08 NOTE — Transfer of Care (Signed)
Immediate Anesthesia Transfer of Care Note  Patient: Donald Jacobson  Procedure(s) Performed: Left hand open reduction and internal fixation and repair as indicated (Left )  Patient Location: PACU  Anesthesia Type:General  Level of Consciousness: awake, alert  and oriented  Airway & Oxygen Therapy: Patient Spontanous Breathing  Post-op Assessment: Report given to RN and Post -op Vital signs reviewed and stable  Post vital signs: Reviewed and stable  Last Vitals:  Vitals Value Taken Time  BP 165/101 12/08/2018  8:50 AM  Temp    Pulse 53 12/08/2018  8:51 AM  Resp 11 12/08/2018  8:51 AM  SpO2 95 % 12/08/2018  8:51 AM  Vitals shown include unvalidated device data.  Last Pain:  Vitals:   12/08/18 0607  TempSrc: Oral  PainSc:       Patients Stated Pain Goal: 3 (12/08/18 0558)  Complications: No apparent anesthesia complications
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
## 2018-12-08 NOTE — Progress Notes (Signed)
Please place orders in Epic as patient is being scheduled for a pre-op appointment! Thank you! 

## 2018-12-09 ENCOUNTER — Encounter (HOSPITAL_COMMUNITY): Payer: Self-pay | Admitting: Orthopaedic Surgery

## 2018-12-10 ENCOUNTER — Inpatient Hospital Stay (HOSPITAL_COMMUNITY): Admit: 2018-12-10 | Payer: Medicaid Other | Admitting: Orthopedic Surgery

## 2018-12-10 ENCOUNTER — Encounter (HOSPITAL_COMMUNITY): Payer: Self-pay

## 2018-12-10 SURGERY — TOTAL KNEE REVISION
Anesthesia: Spinal | Laterality: Left

## 2019-02-26 NOTE — Progress Notes (Signed)
Need orders in epic for 6-18 surgery pre op is 6-10

## 2019-03-03 NOTE — Patient Instructions (Signed)
Donald Jacobson   Your procedure is scheduled on: 03-12-2019  Report to [facility] Main  Entrance  Report to admitting at 800 AM   [place] 19 TEST ON_______ @_______ , THIS TEST MUST BE DONE BEFORE SURGERY, COME TO [place].    Call this number if you have problems the morning of surgery [phone]    Remember: Do not eat food or drink liquids :After Midnight. BRUSH YOUR TEETH MORNING OF SURGERY AND RINSE YOUR MOUTH OUT, NO CHEWING GUM CANDY OR MINTS.     Take these medicines the morning of surgery with A SIP OF WATER: BUSPIRONE (BUSPAR), LURASIDONE (LATUDA)                               You may not have any metal on your body including hair pins and              piercings  Do not wear jewelry, make-up, lotions, powders or perfumes, deodorant             Do not wear nail polish.  Do not shave  48 hours prior to surgery.              Men may shave face and neck.   Do not bring valuables to the hospital. [place].  Contacts, dentures or bridgework may not be worn into surgery.  Leave suitcase in the car. After surgery it may be brought to your room.     _____________________________________________________________________             [facility] - Preparing for Surgery Before surgery, you can play an important role.  Because skin is not sterile, your skin needs to be as free of germs as possible.  You can reduce the number of germs on your skin by washing with CHG (chlorahexidine gluconate) soap before surgery.  CHG is an antiseptic cleaner which kills germs and bonds with the skin to continue killing germs even after washing. Please DO NOT use if you have an allergy to CHG or antibacterial soaps.  If your skin becomes reddened/irritated stop using the CHG and inform your nurse when you arrive at Short Stay. Do not shave (including legs and underarms)  for at least 48 hours prior to the first CHG shower.  You may shave your face/neck. Please follow these instructions carefully:  1.  Shower with CHG Soap the night before surgery and the  morning of Surgery.  2.  If you choose to wash your hair, wash your hair first as usual with your  normal  shampoo.  3.  After you shampoo, rinse your hair and body thoroughly to remove the  shampoo.                           4.  Use CHG as you would any other liquid soap.  You can apply chg directly  to the skin and wash                       Gently with a scrungie or clean washcloth.  5.  Apply the CHG Soap to your body ONLY  FROM THE NECK DOWN.   Do not use on face/ open                           Wound or open sores. Avoid contact with eyes, ears mouth and genitals (private parts).                       Wash face,  Genitals (private parts) with your normal soap.             6.  Wash thoroughly, paying special attention to the area where your surgery  will be performed.  7.  Thoroughly rinse your body with warm water from the neck down.  8.  DO NOT shower/wash with your normal soap after using and rinsing off  the CHG Soap.                9.  Pat yourself dry with a clean towel.            10.  Wear clean pajamas.            11.  Place clean sheets on your bed the night of your first shower and do not  sleep with pets. Day of Surgery : Do not apply any lotions/deodorants the morning of surgery.  Please wear clean clothes to the hospital/surgery center.  FAILURE TO FOLLOW THESE INSTRUCTIONS MAY RESULT IN THE CANCELLATION OF YOUR SURGERY PATIENT SIGNATURE_________________________________  NURSE SIGNATURE__________________________________  ________________________________________________________________________

## 2019-03-04 ENCOUNTER — Encounter (HOSPITAL_COMMUNITY)
Admission: RE | Admit: 2019-03-04 | Discharge: 2019-03-04 | Disposition: A | Discharge status: Home / Self Care | Payer: Medicaid Other | Source: Ambulatory Visit

## 2019-03-09 ENCOUNTER — Other Ambulatory Visit (HOSPITAL_COMMUNITY)
Admission: RE | Admit: 2019-03-09 | Discharge: 2019-03-09 | Disposition: A | Discharge status: Home / Self Care | Payer: Medicaid Other | Source: Ambulatory Visit | Attending: Orthopedic Surgery | Admitting: Orthopedic Surgery

## 2019-03-09 DIAGNOSIS — Z1159 Encounter for screening for other viral diseases: Secondary | ICD-10-CM | POA: Insufficient documentation

## 2019-03-09 NOTE — Patient Instructions (Addendum)
Donald Jacobson    Your procedure is scheduled on: 03-12-2019  Report to [facility] Main  Entrance  Report to admitting at 800 AM   YOU HAD A HAVE A COVID 19 TEST ON 03-09-2019, ONCE YOUR COVID TEST IS COMPLETED, PLEASE BEGIN THE QUARANTINE INSTRUCTIONS AS OUTLINED IN YOUR HANDOUT.   Call this number if you have problems the morning of surgery [phone]    Remember: Do not eat food or drink liquids :After Midnight. BRUSH YOUR TEETH MORNING OF SURGERY AND RINSE YOUR MOUTH OUT, NO CHEWING GUM CANDY OR MINTS.     Take these medicines the morning of surgery with A SIP OF WATER: BUSPIRONE (BUSPAR) IF NEEDED, LATUDA                               You may not have any metal on your body including hair pins and              piercings  Do not wear jewelry, make-up, lotions, powders or perfumes, deodorant             Do not wear nail polish.  Do not shave  48 hours prior to surgery.              Men may shave face and neck.   Do not bring valuables to the hospital. [place].  Contacts, dentures or bridgework may not be worn into surgery.  Leave suitcase in the car. After surgery it may be brought to your room.      _____________________________________________________________________             [facility] - Preparing for Surgery Before surgery, you can play an important role.  Because skin is not sterile, your skin needs to be as free of germs as possible.  You can reduce the number of germs on your skin by washing with CHG (chlorahexidine gluconate) soap before surgery.  CHG is an antiseptic cleaner which kills germs and bonds with the skin to continue killing germs even after washing. Please DO NOT use if you have an allergy to CHG or antibacterial soaps.  If your skin becomes reddened/irritated stop using the CHG and inform your nurse when you arrive at Short Stay. Do not shave (including legs and underarms)  for at least 48 hours prior to the first CHG shower.  You may shave your face/neck. Please follow these instructions carefully:  1.  Shower with CHG Soap the night before surgery and the  morning of Surgery.  2.  If you choose to wash your hair, wash your hair first as usual with your  normal  shampoo.  3.  After you shampoo, rinse your hair and body thoroughly to remove the  shampoo.                           4.  Use CHG as you would any other liquid soap.  You can apply chg directly  to the skin and wash                       Gently with a scrungie or clean washcloth.  5.  Apply the CHG Soap to  your body ONLY FROM THE NECK DOWN.   Do not use on face/ open                           Wound or open sores. Avoid contact with eyes, ears mouth and genitals (private parts).                       Wash face,  Genitals (private parts) with your normal soap.             6.  Wash thoroughly, paying special attention to the area where your surgery  will be performed.  7.  Thoroughly rinse your body with warm water from the neck down.  8.  DO NOT shower/wash with your normal soap after using and rinsing off  the CHG Soap.                9.  Pat yourself dry with a clean towel.            10.  Wear clean pajamas.            11.  Place clean sheets on your bed the night of your first shower and do not  sleep with pets. Day of Surgery : Do not apply any lotions/deodorants the morning of surgery.  Please wear clean clothes to the hospital/surgery center.  FAILURE TO FOLLOW THESE INSTRUCTIONS MAY RESULT IN THE CANCELLATION OF YOUR SURGERY PATIENT SIGNATURE_________________________________  NURSE SIGNATURE__________________________________  ________________________________________________________________________

## 2019-03-09 NOTE — Progress Notes (Signed)
NEED ORDERS IN Epic ASAP FOR 03-12-19 SURGERY, PRE OP IS 03-10-19 AT 800

## 2019-03-10 ENCOUNTER — Encounter (HOSPITAL_COMMUNITY)
Admission: RE | Admit: 2019-03-10 | Discharge: 2019-03-10 | Disposition: A | Discharge status: Home / Self Care | Payer: Medicaid Other | Source: Ambulatory Visit | Attending: Orthopedic Surgery | Admitting: Orthopedic Surgery

## 2019-03-10 ENCOUNTER — Encounter (HOSPITAL_COMMUNITY): Payer: Self-pay | Admitting: Anesthesiology

## 2019-03-10 ENCOUNTER — Ambulatory Visit: Payer: Self-pay | Admitting: Orthopedic Surgery

## 2019-03-10 ENCOUNTER — Other Ambulatory Visit (HOSPITAL_COMMUNITY): Payer: Medicaid Other

## 2019-03-10 ENCOUNTER — Encounter (HOSPITAL_COMMUNITY): Payer: Self-pay

## 2019-03-10 ENCOUNTER — Other Ambulatory Visit: Payer: Self-pay

## 2019-03-10 ENCOUNTER — Encounter (HOSPITAL_COMMUNITY): Payer: Self-pay | Admitting: Physician Assistant

## 2019-03-10 DIAGNOSIS — I10 Essential (primary) hypertension: Secondary | ICD-10-CM | POA: Insufficient documentation

## 2019-03-10 DIAGNOSIS — Z01818 Encounter for other preprocedural examination: Secondary | ICD-10-CM | POA: Diagnosis present

## 2019-03-10 DIAGNOSIS — R001 Bradycardia, unspecified: Secondary | ICD-10-CM | POA: Diagnosis not present

## 2019-03-10 LAB — COMPREHENSIVE METABOLIC PANEL
ALT: 19 U/L (ref 0–44)
AST: 20 U/L (ref 15–41)
Albumin: 4.3 g/dL (ref 3.5–5.0)
Alkaline Phosphatase: 98 U/L (ref 38–126)
Anion gap: 12 (ref 5–15)
BUN: 18 mg/dL (ref 6–20)
CO2: 23 mmol/L (ref 22–32)
Calcium: 9.5 mg/dL (ref 8.9–10.3)
Chloride: 105 mmol/L (ref 98–111)
Creatinine, Ser: 0.83 mg/dL (ref 0.61–1.24)
GFR calc Af Amer: >60 mL/min (ref >60)
GFR calc non Af Amer: >60 mL/min (ref >60)
Glucose, Bld: 101 mg/dL — ABNORMAL HIGH (ref 70–99)
Potassium: 3.8 mmol/L (ref 3.5–5.1)
Sodium: 140 mmol/L (ref 135–145)
Total Bilirubin: 0.7 mg/dL (ref 0.3–1.2)
Total Protein: 7.8 g/dL (ref 6.5–8.1)

## 2019-03-10 LAB — SURGICAL PCR SCREEN
MRSA, PCR: NEGATIVE
Staphylococcus aureus: NEGATIVE

## 2019-03-10 LAB — ABO/RH: ABO/RH(D): B POS

## 2019-03-10 LAB — CBC
HCT: 43.7 % (ref 39.0–52.0)
Hemoglobin: 14.7 g/dL (ref 13.0–17.0)
MCH: 32 pg (ref 26.0–34.0)
MCHC: 33.6 g/dL (ref 30.0–36.0)
MCV: 95.2 fL (ref 80.0–100.0)
Platelets: 130 10*3/uL — ABNORMAL LOW (ref 150–400)
RBC: 4.59 MIL/uL (ref 4.22–5.81)
RDW: 13.5 % (ref 11.5–15.5)
WBC: 5.2 10*3/uL (ref 4.0–10.5)
nRBC: 0 % (ref 0.0–0.2)

## 2019-03-10 LAB — NOVEL CORONAVIRUS, NAA (HOSP ORDER, SEND-OUT TO REF LAB; TAT 18-24 HRS): SARS-CoV-2, NAA: NOT DETECTED

## 2019-03-12 ENCOUNTER — Encounter (HOSPITAL_COMMUNITY): Payer: Self-pay

## 2019-03-12 ENCOUNTER — Encounter (HOSPITAL_COMMUNITY)
Admission: RE | Disposition: A | Discharge status: Home / Self Care | Payer: Self-pay | Source: Home / Self Care | Attending: Orthopedic Surgery

## 2019-03-12 ENCOUNTER — Ambulatory Visit (HOSPITAL_COMMUNITY)
Admission: RE | Admit: 2019-03-12 | Discharge: 2019-03-12 | Disposition: A | Discharge status: Home / Self Care | Payer: Medicaid Other | Attending: Orthopedic Surgery | Admitting: Orthopedic Surgery

## 2019-03-12 DIAGNOSIS — T84093A Other mechanical complication of internal left knee prosthesis, initial encounter: Secondary | ICD-10-CM | POA: Insufficient documentation

## 2019-03-12 DIAGNOSIS — X58XXXA Exposure to other specified factors, initial encounter: Secondary | ICD-10-CM | POA: Insufficient documentation

## 2019-03-12 DIAGNOSIS — Z5309 Procedure and treatment not carried out because of other contraindication: Secondary | ICD-10-CM | POA: Diagnosis not present

## 2019-03-12 LAB — RAPID URINE DRUG SCREEN, HOSP PERFORMED
Amphetamines: NOT DETECTED
Barbiturates: NOT DETECTED
Benzodiazepines: NOT DETECTED
Cocaine: POSITIVE — AB
Opiates: NOT DETECTED
Tetrahydrocannabinol: POSITIVE — AB

## 2019-03-12 LAB — TYPE AND SCREEN
ABO/RH(D): B POS
Antibody Screen: NEGATIVE

## 2019-03-12 SURGERY — TOTAL KNEE REVISION
Anesthesia: Spinal | Laterality: Left

## 2019-03-12 MED ORDER — SODIUM CHLORIDE 0.9 % IV SOLN: INTRAVENOUS | Status: DC

## 2019-03-12 MED ORDER — LACTATED RINGERS IV SOLN
INTRAVENOUS | Status: DC
  Administered 2019-03-12: 09:00:00 via INTRAVENOUS

## 2019-03-12 MED ORDER — CEFAZOLIN SODIUM-DEXTROSE 2-4 GM/100ML-% IV SOLN: 2.0000 g | INTRAVENOUS | Status: DC

## 2019-03-12 MED ORDER — CEFAZOLIN SODIUM-DEXTROSE 2-4 GM/100ML-% IV SOLN
INTRAVENOUS | Status: AC
  Filled 2019-03-12: qty 100

## 2019-03-12 MED ORDER — POVIDONE-IODINE 10 % EX SWAB: 2.0000 "application " | Freq: Once | CUTANEOUS | Status: DC

## 2019-03-12 MED ORDER — TRANEXAMIC ACID-NACL 1000-0.7 MG/100ML-% IV SOLN: 1000.0000 mg | INTRAVENOUS | Status: DC

## 2019-03-12 MED ORDER — FENTANYL CITRATE (PF) 100 MCG/2ML IJ SOLN
50.0000 ug | INTRAMUSCULAR | Status: DC
  Filled 2019-03-12: qty 2

## 2019-03-12 MED ORDER — CHLORHEXIDINE GLUCONATE 4 % EX LIQD: 60.0000 mL | Freq: Once | CUTANEOUS | Status: DC

## 2019-03-12 MED ORDER — MIDAZOLAM HCL 2 MG/2ML IJ SOLN
1.0000 mg | INTRAMUSCULAR | Status: DC
  Filled 2019-03-12: qty 2

## 2019-03-12 NOTE — Anesthesia Preprocedure Evaluation (Deleted)
Anesthesia Evaluation    Airway        Dental   Pulmonary Current Smoker,           Cardiovascular hypertension,      Neuro/Psych    GI/Hepatic   Endo/Other    Renal/GU      Musculoskeletal   Abdominal   Peds  Hematology   Anesthesia Other Findings   Reproductive/Obstetrics                             Anesthesia Physical Anesthesia Plan  ASA:   Anesthesia Plan:    Post-op Pain Management:    Induction:   PONV Risk Score and Plan:   Airway Management Planned:   Additional Equipment:   Intra-op Plan:   Post-operative Plan:   Informed Consent:   Plan Discussed with:   Anesthesia Plan Comments: (Cancelled due to positive cocaine test. )        Anesthesia Quick Evaluation

## 2019-03-12 NOTE — Progress Notes (Signed)
Surgery canceled today due to postive cocaine and marijuana on urine drug screen.  Anesthesiologist spoke with pt.  Iv site d/c and pressure dressing applied.  Pt dressed, called his ride and is ambulatory to lobby.

## 2019-03-17 ENCOUNTER — Ambulatory Visit: Payer: Self-pay | Admitting: Orthopedic Surgery

## 2019-03-23 NOTE — Patient Instructions (Addendum)
Donald Jacobson    Your procedure is scheduled on: 04-02-2019   [facility]  Report to admitting at 8:00 AM    YOU NEED TO HAVE A COVID 19 TEST ON_MONDAY 7/6______ @ 9:25_______, THIS TEST MUST BE DONE BEFORE SURGERY, [facility] . ONCE YOUR COVID TEST IS COMPLETED, PLEASE BEGIN THE QUARANTINE INSTRUCTIONS AS OUTLINED IN YOUR HANDOUT.   Call this number if you have problems the morning of surgery [phone]    Remember:. BRUSH YOUR TEETH MORNING OF SURGERY AND RINSE YOUR MOUTH OUT, NO CHEWING GUM CANDY OR MINTS.   NO SOLID FOOD AFTER MIDNIGHT THE NIGHT PRIOR TO SURGERY.  NOTHING BY MOUTH EXCEPT CLEAR LIQUIDS UNTIL 7:30 am.  PLEASE FINISH ENSURE DRINK PER SURGEON ORDER 3 HOURS PRIOR TO SCHEDULED SURGERY TIME WHICH NEEDS TO BE COMPLETED AT 7:30 am.    CLEAR LIQUID DIET   Foods Allowed                                                                     Foods Excluded  Coffee and tea, regular and decaf                             liquids that you cannot  Plain Jell-O in any flavor                                             see through such as: Fruit ices (not with fruit pulp)                                     milk, soups, orange juice  Iced Popsicles                                    All solid food Carbonated beverages, regular and diet                                    Cranberry, grape and apple juices Sports drinks like Gatorade Lightly seasoned clear broth or consume(fat free) Sugar, honey syrup  Sample Menu Breakfast                                Lunch                                     Supper Cranberry juice                    Beef broth  Chicken broth Jell-O                                     Grape juice                           Apple juice Coffee or tea                        Jell-O                                      Popsicle                                                 Coffee or tea                        Coffee or tea  _____________________________________________________________________   Take these medicines the morning of surgery with A SIP OF WATER:  Minipress, Lattuda                                You may not have any metal on your body piercings          Do not wear jewelry,lotions, powders or , deodorant                         Men may shave face and neck.   Do not bring valuables to the hospital. [facility] IS NOT             RESPONSIBLE   FOR VALUABLES.  Contacts, dentures or bridgework may not be worn into surgery.            [facility] - Preparing for Surgery  Before surgery, you can play an important role .  Because skin is not sterile, your skin needs to be as free of germs as possible.   You can reduce the number of germs on your skin by washing with CHG (chlorahexidine gluconate) soap before surgery.   CHG is an antiseptic cleaner which kills germs and bonds with the skin to continue killing germs even after washing. Please DO NOT use if you have an allergy to CHG or antibacterial soaps .  If your skin becomes reddened/irritated stop using the CHG and inform your nurse when you arrive at Short Stay.    You may shave your face/neck. Please follow these instructions carefully:   1.  Shower with CHG Soap the night before surgery and the  morning of Surgery.  2.  If you choose to wash your hair, wash your hair first as usual with your  normal  shampoo.  3.  After you shampoo, rinse your hair and body thoroughly to remove the  shampoo.                                        4.  Use CHG as you would any other liquid  soap.  You can apply chg directly  to the skin and wash                       Gently with a scrungie or clean washcloth.  5.  Apply the CHG Soap to your body ONLY FROM THE NECK DOWN.   Do not use on face/ open                           Wound or open sores. Avoid contact with eyes, ears mouth  and genitals (private parts).                       Wash face,  Genitals (private parts) with your normal soap.             6.  Wash thoroughly, paying special attention to the area where your surgery  will be performed.  7.  Thoroughly rinse your body with warm water from the neck down.  8.  DO NOT shower/wash with your normal soap after using and rinsing off  the CHG Soap.             9.  Pat yourself dry with a clean towel.            10.  Wear clean pajamas.            11.  Place clean sheets on your bed the night of your first shower and do not  sleep with pets . Day of Surgery : Do not apply any lotions/deodorants the morning of surgery.  Please wear clean clothes to the hospital/surgery center.  FAILURE TO FOLLOW THESE INSTRUCTIONS MAY RESULT IN THE CANCELLATION OF YOUR SURGERY PATIENT SIGNATURE_________________________________  NURSE SIGNATURE__________________________________  ________________________________________________________________________   Donald MireIncentive Spirometer  An incentive spirometer is a tool that can help keep your lungs clear and active. This tool measures how well you are filling your lungs with each breath. Taking long deep breaths may help reverse or decrease the chance of developing breathing (pulmonary) problems (especially infection) following:  A long period of time when you are unable to move or be active. BEFORE THE PROCEDURE   If the spirometer includes an indicator to show your best effort, your nurse or respiratory therapist will set it to a desired goal.  If possible, sit up straight or lean slightly forward. Try not to slouch.  Hold the incentive spirometer in an upright position. INSTRUCTIONS FOR USE  1. Sit on the edge of your bed if possible, or sit up as far as you can in bed or on a chair. 2. Hold the incentive spirometer in an upright position. 3. Breathe out normally. 4. Place the mouthpiece in your mouth and seal your lips tightly  around it. 5. Breathe in slowly and as deeply as possible, raising the piston or the ball toward the top of the column. 6. Hold your breath for 3-5 seconds or for as long as possible. Allow the piston or ball to fall to the bottom of the column. 7. Remove the mouthpiece from your mouth and breathe out normally. 8. Rest for a few seconds and repeat Steps 1 through 7 at least 10 times every 1-2 hours when you are awake. Take your time and take a few normal breaths between deep breaths. 9. The spirometer may include an indicator to show your best effort. Use the indicator as a goal to work  toward during each repetition. 10. After each set of 10 deep breaths, practice coughing to be sure your lungs are clear. If you have an incision (the cut made at the time of surgery), support your incision when coughing by placing a pillow or rolled up towels firmly against it. Once you are able to get out of bed, walk around indoors and cough well. You may stop using the incentive spirometer when instructed by your caregiver.  RISKS AND COMPLICATIONS  Take your time so you do not get dizzy or light-headed.  If you are in pain, you may need to take or ask for pain medication before doing incentive spirometry. It is harder to take a deep breath if you are having pain. AFTER USE  Rest and breathe slowly and easily.  It can be helpful to keep track of a log of your progress. Your caregiver can provide you with a simple table to help with this. If you are using the spirometer at home, follow these instructions: SEEK MEDICAL CARE IF:   You are having difficultly using the spirometer.  You have trouble using the spirometer as often as instructed.  Your pain medication is not giving enough relief while using the spirometer.  You develop fever of 100.5 F (38.1 C) or higher. SEEK IMMEDIATE MEDICAL CARE IF:   You cough up bloody sputum that had not been present before.  You develop fever of 102 F (38.9 C) or  greater.  You develop worsening pain at or near the incision site. MAKE SURE YOU:   Understand these instructions.  Will watch your condition.  Will get help right away if you are not doing well or get worse. Document Released: 01/21/2007 Document Revised: 12/03/2011 Document Reviewed: 03/24/2007 Exit[employer] Patient Information 2014 [employer], [place].   ________________________________________________________________________  WHAT IS A BLOOD TRANSFUSION? Blood Transfusion Information  A transfusion is the replacement of blood or some of its parts. Blood is made up of multiple cells which provide different functions.  Red blood cells carry oxygen and are used for blood loss replacement.  White blood cells fight against infection.  Platelets control bleeding.  Plasma helps clot blood.  Other blood products are available for specialized needs, such as hemophilia or other clotting disorders. BEFORE THE TRANSFUSION  Who gives blood for transfusions?   Healthy volunteers who are fully evaluated to make sure their blood is safe. This is blood bank blood. Transfusion therapy is the safest it has ever been in the practice of medicine. Before blood is taken from a donor, a complete history is taken to make sure that person has no history of diseases nor engages in risky social behavior (examples are intravenous drug use or sexual activity with multiple partners). The donor's travel history is screened to minimize risk of transmitting infections, such as malaria. The donated blood is tested for signs of infectious diseases, such as HIV and hepatitis. The blood is then tested to be sure it is compatible with you in order to minimize the chance of a transfusion reaction. If you or a relative donates blood, this is often done in anticipation of surgery and is not appropriate for emergency situations. It takes many days to process the donated blood. RISKS AND COMPLICATIONS Although transfusion therapy  is very safe and saves many lives, the main dangers of transfusion include:   Getting an infectious disease.  Developing a transfusion reaction. This is an allergic reaction to something in the blood you were given. Every precaution is taken  to prevent this. The decision to have a blood transfusion has been considered carefully by your caregiver before blood is given. Blood is not given unless the benefits outweigh the risks. AFTER THE TRANSFUSION  Right after receiving a blood transfusion, you will usually feel much better and more energetic. This is especially true if your red blood cells have gotten low (anemic). The transfusion raises the level of the red blood cells which carry oxygen, and this usually causes an energy increase.  The nurse administering the transfusion will monitor you carefully for complications. HOME CARE INSTRUCTIONS  No special instructions are needed after a transfusion. You may find your energy is better. Speak with your caregiver about any limitations on activity for underlying diseases you may have. SEEK MEDICAL CARE IF:   Your condition is not improving after your transfusion.  You develop redness or irritation at the intravenous (IV) site. SEEK IMMEDIATE MEDICAL CARE IF:  Any of the following symptoms occur over the next 12 hours:  Shaking chills.  You have a temperature by mouth above 102 F (38.9 C), not controlled by medicine.  Chest, back, or muscle pain.  People around you feel you are not acting correctly or are confused.  Shortness of breath or difficulty breathing.  Dizziness and fainting.  You get a rash or develop hives.  You have a decrease in urine output.  Your urine turns a dark color or changes to pink, red, or brown. Any of the following symptoms occur over the next 10 days:  You have a temperature by mouth above 102 F (38.9 C), not controlled by medicine.  Shortness of breath.  Weakness after normal activity.  The white  part of the eye turns yellow (jaundice).  You have a decrease in the amount of urine or are urinating less often.  Your urine turns a dark color or changes to pink, red, or brown. Document Released: 09/07/2000 Document Revised: 12/03/2011 Document Reviewed: 04/26/2008 [facility] Patient Information 2014 [place], [place].  _______________________________________________________________________

## 2019-03-24 ENCOUNTER — Encounter (HOSPITAL_COMMUNITY)
Admission: RE | Admit: 2019-03-24 | Discharge: 2019-03-24 | Disposition: A | Discharge status: Home / Self Care | Payer: Medicaid Other | Source: Ambulatory Visit | Attending: Orthopedic Surgery | Admitting: Orthopedic Surgery

## 2019-03-24 ENCOUNTER — Other Ambulatory Visit: Payer: Self-pay

## 2019-03-24 ENCOUNTER — Encounter (HOSPITAL_COMMUNITY): Payer: Self-pay

## 2019-03-24 DIAGNOSIS — Z01812 Encounter for preprocedural laboratory examination: Secondary | ICD-10-CM | POA: Insufficient documentation

## 2019-03-24 LAB — CBC
HCT: 42.8 % (ref 39.0–52.0)
Hemoglobin: 14.5 g/dL (ref 13.0–17.0)
MCH: 32.1 pg (ref 26.0–34.0)
MCHC: 33.9 g/dL (ref 30.0–36.0)
MCV: 94.7 fL (ref 80.0–100.0)
Platelets: 142 10*3/uL — ABNORMAL LOW (ref 150–400)
RBC: 4.52 MIL/uL (ref 4.22–5.81)
RDW: 13.4 % (ref 11.5–15.5)
WBC: 5 10*3/uL (ref 4.0–10.5)
nRBC: 0 % (ref 0.0–0.2)

## 2019-03-24 LAB — BASIC METABOLIC PANEL
Anion gap: 8 (ref 5–15)
BUN: 16 mg/dL (ref 6–20)
CO2: 24 mmol/L (ref 22–32)
Calcium: 9.3 mg/dL (ref 8.9–10.3)
Chloride: 106 mmol/L (ref 98–111)
Creatinine, Ser: 1.09 mg/dL (ref 0.61–1.24)
GFR calc Af Amer: >60 mL/min (ref >60)
GFR calc non Af Amer: >60 mL/min (ref >60)
Glucose, Bld: 133 mg/dL — ABNORMAL HIGH (ref 70–99)
Potassium: 3.7 mmol/L (ref 3.5–5.1)
Sodium: 138 mmol/L (ref 135–145)

## 2019-03-24 LAB — SURGICAL PCR SCREEN
MRSA, PCR: NEGATIVE
Staphylococcus aureus: NEGATIVE

## 2019-03-30 ENCOUNTER — Other Ambulatory Visit (HOSPITAL_COMMUNITY)
Admission: RE | Admit: 2019-03-30 | Discharge: 2019-03-30 | Disposition: A | Discharge status: Home / Self Care | Payer: Medicaid Other | Source: Ambulatory Visit | Attending: Orthopedic Surgery | Admitting: Orthopedic Surgery

## 2019-03-30 DIAGNOSIS — Z1159 Encounter for screening for other viral diseases: Secondary | ICD-10-CM | POA: Diagnosis not present

## 2019-03-30 DIAGNOSIS — Z01812 Encounter for preprocedural laboratory examination: Secondary | ICD-10-CM | POA: Diagnosis not present

## 2019-03-30 LAB — SARS CORONAVIRUS 2 (TAT 6-24 HRS): SARS Coronavirus 2: NEGATIVE

## 2019-04-02 ENCOUNTER — Inpatient Hospital Stay (HOSPITAL_COMMUNITY): Payer: Medicaid Other | Admitting: Anesthesiology

## 2019-04-02 ENCOUNTER — Encounter (HOSPITAL_COMMUNITY): Payer: Self-pay

## 2019-04-02 ENCOUNTER — Encounter (HOSPITAL_COMMUNITY)
Admission: RE | Disposition: A | Discharge status: Home Health | Payer: Self-pay | Source: Home / Self Care | Attending: Orthopedic Surgery

## 2019-04-02 ENCOUNTER — Inpatient Hospital Stay (HOSPITAL_COMMUNITY): Payer: Medicaid Other

## 2019-04-02 ENCOUNTER — Other Ambulatory Visit: Payer: Self-pay

## 2019-04-02 ENCOUNTER — Inpatient Hospital Stay (HOSPITAL_COMMUNITY)
Admission: RE | Admit: 2019-04-02 | Discharge: 2019-04-03 | DRG: 468 | Disposition: A | Discharge status: Home Health | Payer: Medicaid Other | Attending: Orthopedic Surgery | Admitting: Orthopedic Surgery

## 2019-04-02 ENCOUNTER — Inpatient Hospital Stay (HOSPITAL_COMMUNITY): Payer: Medicaid Other | Admitting: Physician Assistant

## 2019-04-02 DIAGNOSIS — F418 Other specified anxiety disorders: Secondary | ICD-10-CM | POA: Diagnosis present

## 2019-04-02 DIAGNOSIS — T84093A Other mechanical complication of internal left knee prosthesis, initial encounter: Secondary | ICD-10-CM

## 2019-04-02 DIAGNOSIS — Z91041 Radiographic dye allergy status: Secondary | ICD-10-CM | POA: Diagnosis not present

## 2019-04-02 DIAGNOSIS — F319 Bipolar disorder, unspecified: Secondary | ICD-10-CM | POA: Diagnosis present

## 2019-04-02 DIAGNOSIS — I10 Essential (primary) hypertension: Secondary | ICD-10-CM | POA: Diagnosis present

## 2019-04-02 DIAGNOSIS — Y792 Prosthetic and other implants, materials and accessory orthopedic devices associated with adverse incidents: Secondary | ICD-10-CM | POA: Diagnosis present

## 2019-04-02 DIAGNOSIS — F17290 Nicotine dependence, other tobacco product, uncomplicated: Secondary | ICD-10-CM | POA: Diagnosis present

## 2019-04-02 DIAGNOSIS — D696 Thrombocytopenia, unspecified: Secondary | ICD-10-CM | POA: Diagnosis present

## 2019-04-02 DIAGNOSIS — Z8619 Personal history of other infectious and parasitic diseases: Secondary | ICD-10-CM

## 2019-04-02 DIAGNOSIS — T84033A Mechanical loosening of internal left knee prosthetic joint, initial encounter: Secondary | ICD-10-CM | POA: Diagnosis present

## 2019-04-02 HISTORY — PX: TOTAL KNEE REVISION: SHX996

## 2019-04-02 LAB — RAPID URINE DRUG SCREEN, HOSP PERFORMED
Amphetamines: NOT DETECTED
Barbiturates: NOT DETECTED
Benzodiazepines: NOT DETECTED
Cocaine: NOT DETECTED
Opiates: NOT DETECTED
Tetrahydrocannabinol: POSITIVE — AB

## 2019-04-02 LAB — TYPE AND SCREEN
ABO/RH(D): B POS
Antibody Screen: NEGATIVE

## 2019-04-02 SURGERY — TOTAL KNEE REVISION
Anesthesia: Spinal | Laterality: Left

## 2019-04-02 MED ORDER — SENNA 8.6 MG PO TABS
1.0000 | ORAL_TABLET | Freq: Two times a day (BID) | ORAL | Status: DC
  Administered 2019-04-02 – 2019-04-03 (×2): 8.6 mg via ORAL
  Filled 2019-04-02 (×2): qty 1

## 2019-04-02 MED ORDER — MIDAZOLAM HCL 5 MG/5ML IJ SOLN
INTRAMUSCULAR | Status: DC | PRN
  Administered 2019-04-02: 2 mg via INTRAVENOUS

## 2019-04-02 MED ORDER — HYDROCODONE-ACETAMINOPHEN 5-325 MG PO TABS: 1.0000 | ORAL_TABLET | ORAL | Status: DC | PRN

## 2019-04-02 MED ORDER — METHOCARBAMOL 500 MG IVPB - SIMPLE MED
500.0000 mg | Freq: Four times a day (QID) | INTRAVENOUS | Status: DC | PRN
  Administered 2019-04-02: 500 mg via INTRAVENOUS
  Filled 2019-04-02: qty 50

## 2019-04-02 MED ORDER — CHLORHEXIDINE GLUCONATE 4 % EX LIQD: 60.0000 mL | Freq: Once | CUTANEOUS | Status: DC

## 2019-04-02 MED ORDER — MORPHINE SULFATE (PF) 4 MG/ML IV SOLN
0.5000 mg | INTRAVENOUS | Status: DC | PRN
  Administered 2019-04-02: 1 mg via INTRAVENOUS
  Filled 2019-04-02: qty 1

## 2019-04-02 MED ORDER — CELECOXIB 200 MG PO CAPS
200.0000 mg | ORAL_CAPSULE | Freq: Two times a day (BID) | ORAL | Status: DC
  Administered 2019-04-02 – 2019-04-03 (×2): 200 mg via ORAL
  Filled 2019-04-02 (×2): qty 1

## 2019-04-02 MED ORDER — POLYETHYLENE GLYCOL 3350 17 G PO PACK: 17.0000 g | PACK | Freq: Every day | ORAL | Status: DC | PRN

## 2019-04-02 MED ORDER — BUPIVACAINE-EPINEPHRINE (PF) 0.25% -1:200000 IJ SOLN
INTRAMUSCULAR | Status: AC
  Filled 2019-04-02: qty 30

## 2019-04-02 MED ORDER — ONDANSETRON HCL 4 MG/2ML IJ SOLN: 4.0000 mg | Freq: Once | INTRAMUSCULAR | Status: DC | PRN

## 2019-04-02 MED ORDER — LURASIDONE HCL 60 MG PO TABS: 60.0000 mg | ORAL_TABLET | Freq: Every day | ORAL | Status: DC

## 2019-04-02 MED ORDER — ONDANSETRON HCL 4 MG/2ML IJ SOLN: 4.0000 mg | Freq: Four times a day (QID) | INTRAMUSCULAR | Status: DC | PRN

## 2019-04-02 MED ORDER — GLYCOPYRROLATE 0.2 MG/ML IJ SOLN
INTRAMUSCULAR | Status: DC | PRN
  Administered 2019-04-02: 0.2 mg via INTRAVENOUS

## 2019-04-02 MED ORDER — KETOROLAC TROMETHAMINE 30 MG/ML IJ SOLN
INTRAMUSCULAR | Status: AC
  Filled 2019-04-02: qty 1

## 2019-04-02 MED ORDER — ALUM & MAG HYDROXIDE-SIMETH 200-200-20 MG/5ML PO SUSP: 30.0000 mL | ORAL | Status: DC | PRN

## 2019-04-02 MED ORDER — PHENOL 1.4 % MT LIQD: 1.0000 | OROMUCOSAL | Status: DC | PRN

## 2019-04-02 MED ORDER — ONDANSETRON HCL 4 MG/2ML IJ SOLN
INTRAMUSCULAR | Status: AC
  Filled 2019-04-02: qty 2

## 2019-04-02 MED ORDER — FENTANYL CITRATE (PF) 100 MCG/2ML IJ SOLN
INTRAMUSCULAR | Status: AC
  Filled 2019-04-02: qty 2

## 2019-04-02 MED ORDER — PROPOFOL 10 MG/ML IV BOLUS
INTRAVENOUS | Status: AC
  Filled 2019-04-02: qty 20

## 2019-04-02 MED ORDER — SODIUM CHLORIDE (PF) 0.9 % IJ SOLN
INTRAMUSCULAR | Status: AC
  Filled 2019-04-02: qty 50

## 2019-04-02 MED ORDER — KETOROLAC TROMETHAMINE 30 MG/ML IJ SOLN
INTRAMUSCULAR | Status: DC | PRN
  Administered 2019-04-02: 30 mg

## 2019-04-02 MED ORDER — ONDANSETRON HCL 4 MG/2ML IJ SOLN
INTRAMUSCULAR | Status: DC | PRN
  Administered 2019-04-02: 4 mg via INTRAVENOUS

## 2019-04-02 MED ORDER — FENTANYL CITRATE (PF) 100 MCG/2ML IJ SOLN
50.0000 ug | INTRAMUSCULAR | Status: DC
  Administered 2019-04-02 (×2): 50 ug via INTRAVENOUS
  Filled 2019-04-02: qty 2

## 2019-04-02 MED ORDER — DEXAMETHASONE SODIUM PHOSPHATE 10 MG/ML IJ SOLN
10.0000 mg | Freq: Once | INTRAMUSCULAR | Status: AC
  Administered 2019-04-03: 10 mg via INTRAVENOUS
  Filled 2019-04-02: qty 1

## 2019-04-02 MED ORDER — APIXABAN 2.5 MG PO TABS
2.5000 mg | ORAL_TABLET | Freq: Two times a day (BID) | ORAL | Status: DC
  Administered 2019-04-03: 2.5 mg via ORAL
  Filled 2019-04-02: qty 1

## 2019-04-02 MED ORDER — STERILE WATER FOR IRRIGATION IR SOLN
Status: DC | PRN
  Administered 2019-04-02: 2000 mL

## 2019-04-02 MED ORDER — BUPIVACAINE IN DEXTROSE 0.75-8.25 % IT SOLN
INTRATHECAL | Status: DC | PRN
  Administered 2019-04-02: 1.8 mL via INTRATHECAL

## 2019-04-02 MED ORDER — PROPOFOL 500 MG/50ML IV EMUL: INTRAVENOUS | Status: DC | PRN

## 2019-04-02 MED ORDER — FENTANYL CITRATE (PF) 100 MCG/2ML IJ SOLN
INTRAMUSCULAR | Status: DC | PRN
  Administered 2019-04-02: 100 ug via INTRAVENOUS
  Administered 2019-04-02 (×2): 50 ug via INTRAVENOUS

## 2019-04-02 MED ORDER — DOCUSATE SODIUM 100 MG PO CAPS
100.0000 mg | ORAL_CAPSULE | Freq: Two times a day (BID) | ORAL | Status: DC
  Administered 2019-04-02 – 2019-04-03 (×2): 100 mg via ORAL
  Filled 2019-04-02: qty 1

## 2019-04-02 MED ORDER — BUPIVACAINE-EPINEPHRINE 0.25% -1:200000 IJ SOLN
INTRAMUSCULAR | Status: DC | PRN
  Administered 2019-04-02: 30 mL

## 2019-04-02 MED ORDER — ACETAMINOPHEN 325 MG PO TABS: 325.0000 mg | ORAL_TABLET | Freq: Four times a day (QID) | ORAL | Status: DC | PRN

## 2019-04-02 MED ORDER — PRAZOSIN HCL 1 MG PO CAPS: 2.0000 mg | ORAL_CAPSULE | Freq: Every day | ORAL | Status: DC

## 2019-04-02 MED ORDER — TRANEXAMIC ACID-NACL 1000-0.7 MG/100ML-% IV SOLN
1000.0000 mg | INTRAVENOUS | Status: AC
  Administered 2019-04-02: 1000 mg via INTRAVENOUS
  Filled 2019-04-02: qty 100

## 2019-04-02 MED ORDER — HYDROCODONE-ACETAMINOPHEN 7.5-325 MG PO TABS
1.0000 | ORAL_TABLET | ORAL | Status: DC | PRN
  Administered 2019-04-02 – 2019-04-03 (×5): 2 via ORAL
  Filled 2019-04-02 (×5): qty 2

## 2019-04-02 MED ORDER — ACETAMINOPHEN 10 MG/ML IV SOLN
1000.0000 mg | INTRAVENOUS | Status: AC
  Administered 2019-04-02: 2 mg via INTRAVENOUS
  Administered 2019-04-02: 1000 mg via INTRAVENOUS
  Filled 2019-04-02: qty 100

## 2019-04-02 MED ORDER — DEXAMETHASONE SODIUM PHOSPHATE 10 MG/ML IJ SOLN
INTRAMUSCULAR | Status: DC | PRN
  Administered 2019-04-02: 8 mg via INTRAVENOUS

## 2019-04-02 MED ORDER — SODIUM CHLORIDE 0.9 % IV SOLN
INTRAVENOUS | Status: DC
  Administered 2019-04-02: 1000 mL via INTRAVENOUS

## 2019-04-02 MED ORDER — PROPOFOL 10 MG/ML IV BOLUS
INTRAVENOUS | Status: AC
  Filled 2019-04-02: qty 60

## 2019-04-02 MED ORDER — FENTANYL CITRATE (PF) 100 MCG/2ML IJ SOLN
INTRAMUSCULAR | Status: AC
  Administered 2019-04-02: 50 ug via INTRAVENOUS
  Filled 2019-04-02: qty 2

## 2019-04-02 MED ORDER — ROPIVACAINE HCL 7.5 MG/ML IJ SOLN
INTRAMUSCULAR | Status: DC | PRN
  Administered 2019-04-02: 20 mL via PERINEURAL

## 2019-04-02 MED ORDER — SODIUM CHLORIDE 0.9 % IV SOLN: INTRAVENOUS | Status: DC

## 2019-04-02 MED ORDER — FENTANYL CITRATE (PF) 100 MCG/2ML IJ SOLN
25.0000 ug | INTRAMUSCULAR | Status: DC | PRN
  Administered 2019-04-02: 50 ug via INTRAVENOUS

## 2019-04-02 MED ORDER — SODIUM CHLORIDE 0.9 % IV SOLN
INTRAVENOUS | Status: DC | PRN
  Administered 2019-04-02: 25 ug/min via INTRAVENOUS

## 2019-04-02 MED ORDER — METOCLOPRAMIDE HCL 5 MG/ML IJ SOLN: 5.0000 mg | Freq: Three times a day (TID) | INTRAMUSCULAR | Status: DC | PRN

## 2019-04-02 MED ORDER — BUSPIRONE HCL 10 MG PO TABS
10.0000 mg | ORAL_TABLET | Freq: Two times a day (BID) | ORAL | Status: DC
  Administered 2019-04-02 – 2019-04-03 (×2): 10 mg via ORAL
  Filled 2019-04-02: qty 2
  Filled 2019-04-02: qty 1
  Filled 2019-04-02: qty 2
  Filled 2019-04-02: qty 1

## 2019-04-02 MED ORDER — 0.9 % SODIUM CHLORIDE (POUR BTL) OPTIME
TOPICAL | Status: DC | PRN
  Administered 2019-04-02: 1000 mL

## 2019-04-02 MED ORDER — ADULT MULTIVITAMIN W/MINERALS CH
1.0000 | ORAL_TABLET | Freq: Every day | ORAL | Status: DC
  Administered 2019-04-03: 1 via ORAL
  Filled 2019-04-02: qty 1

## 2019-04-02 MED ORDER — GLYCOPYRROLATE PF 0.2 MG/ML IJ SOSY
PREFILLED_SYRINGE | INTRAMUSCULAR | Status: AC
  Filled 2019-04-02: qty 2

## 2019-04-02 MED ORDER — METHOCARBAMOL 500 MG IVPB - SIMPLE MED
INTRAVENOUS | Status: AC
  Administered 2019-04-02: 16:00:00 500 mg via INTRAVENOUS
  Filled 2019-04-02: qty 50

## 2019-04-02 MED ORDER — MIDAZOLAM HCL 2 MG/2ML IJ SOLN
1.0000 mg | INTRAMUSCULAR | Status: DC
  Administered 2019-04-02: 2 mg via INTRAVENOUS
  Filled 2019-04-02: qty 2

## 2019-04-02 MED ORDER — PROPOFOL 10 MG/ML IV BOLUS
INTRAVENOUS | Status: DC | PRN
  Administered 2019-04-02: 20 mg via INTRAVENOUS
  Administered 2019-04-02: 40 mg via INTRAVENOUS

## 2019-04-02 MED ORDER — POVIDONE-IODINE 10 % EX SWAB: 2.0000 "application " | Freq: Once | CUTANEOUS | Status: DC

## 2019-04-02 MED ORDER — SODIUM CHLORIDE (PF) 0.9 % IJ SOLN
INTRAMUSCULAR | Status: DC | PRN
  Administered 2019-04-02: 30 mL

## 2019-04-02 MED ORDER — MIDAZOLAM HCL 2 MG/2ML IJ SOLN
INTRAMUSCULAR | Status: AC
  Filled 2019-04-02: qty 2

## 2019-04-02 MED ORDER — DIPHENHYDRAMINE HCL 12.5 MG/5ML PO ELIX: 12.5000 mg | ORAL_SOLUTION | ORAL | Status: DC | PRN

## 2019-04-02 MED ORDER — ONDANSETRON HCL 4 MG PO TABS: 4.0000 mg | ORAL_TABLET | Freq: Four times a day (QID) | ORAL | Status: DC | PRN

## 2019-04-02 MED ORDER — ISOPROPYL ALCOHOL 70 % SOLN
Status: DC | PRN
  Administered 2019-04-02: 1 via TOPICAL

## 2019-04-02 MED ORDER — OXYCODONE HCL 5 MG PO TABS: 5.0000 mg | ORAL_TABLET | Freq: Once | ORAL | Status: DC | PRN

## 2019-04-02 MED ORDER — LACTATED RINGERS IV SOLN
INTRAVENOUS | Status: DC
  Administered 2019-04-02 (×3): via INTRAVENOUS

## 2019-04-02 MED ORDER — OXYCODONE HCL 5 MG/5ML PO SOLN: 5.0000 mg | Freq: Once | ORAL | Status: DC | PRN

## 2019-04-02 MED ORDER — METHOCARBAMOL 500 MG PO TABS
500.0000 mg | ORAL_TABLET | Freq: Four times a day (QID) | ORAL | Status: DC | PRN
  Administered 2019-04-02 – 2019-04-03 (×2): 500 mg via ORAL
  Filled 2019-04-02 (×2): qty 1

## 2019-04-02 MED ORDER — PROPOFOL 500 MG/50ML IV EMUL
INTRAVENOUS | Status: DC | PRN
  Administered 2019-04-02: 100 ug/kg/min via INTRAVENOUS

## 2019-04-02 MED ORDER — MENTHOL 3 MG MT LOZG: 1.0000 | LOZENGE | OROMUCOSAL | Status: DC | PRN

## 2019-04-02 MED ORDER — DEXAMETHASONE SODIUM PHOSPHATE 10 MG/ML IJ SOLN
INTRAMUSCULAR | Status: AC
  Filled 2019-04-02: qty 1

## 2019-04-02 MED ORDER — SODIUM CHLORIDE 0.9 % IR SOLN
Status: DC | PRN
  Administered 2019-04-02: 3000 mL

## 2019-04-02 MED ORDER — CEFAZOLIN SODIUM-DEXTROSE 2-4 GM/100ML-% IV SOLN
2.0000 g | INTRAVENOUS | Status: AC
  Administered 2019-04-02: 2 g via INTRAVENOUS
  Filled 2019-04-02: qty 100

## 2019-04-02 MED ORDER — METOCLOPRAMIDE HCL 5 MG PO TABS: 5.0000 mg | ORAL_TABLET | Freq: Three times a day (TID) | ORAL | Status: DC | PRN

## 2019-04-02 MED ORDER — SODIUM CHLORIDE 0.9 % IR SOLN
Status: DC | PRN
  Administered 2019-04-02: 1000 mL

## 2019-04-02 MED ORDER — CEFAZOLIN SODIUM-DEXTROSE 2-4 GM/100ML-% IV SOLN
2.0000 g | Freq: Four times a day (QID) | INTRAVENOUS | Status: AC
  Administered 2019-04-02 – 2019-04-03 (×2): 2 g via INTRAVENOUS
  Filled 2019-04-02 (×2): qty 100

## 2019-04-02 MED ORDER — HYDROCHLOROTHIAZIDE 25 MG PO TABS
25.0000 mg | ORAL_TABLET | Freq: Every day | ORAL | Status: DC
  Administered 2019-04-03: 25 mg via ORAL
  Filled 2019-04-02: qty 1

## 2019-04-02 SURGICAL SUPPLY — 79 items
ADAPTER OFFSET W/SCREWS 5 (Joint) ×1 IMPLANT
ADAPTER OFFSET W/SCREWS 5MM (Joint) ×1 IMPLANT
AUG FEM KNEE LG CRUCIATE WING (Orthopedic Implant) ×3 IMPLANT
AUGMENT FEM KNEE LG CRUCI WING (Orthopedic Implant) IMPLANT
BAG DECANTER FOR FLEXI CONT (MISCELLANEOUS) ×2 IMPLANT
BAG ZIPLOCK 12X15 (MISCELLANEOUS) ×3 IMPLANT
BANDAGE ACE 6X5 VEL STRL LF (GAUZE/BANDAGES/DRESSINGS) ×5 IMPLANT
BLADE SAW RECIPROCATING 77.5 (BLADE) ×1 IMPLANT
BLADE SAW SGTL 81X20 HD (BLADE) ×3 IMPLANT
BLADE SURG SZ10 CARB STEEL (BLADE) ×3 IMPLANT
BNDG CONFORM 6X.82 1P STRL (GAUZE/BANDAGES/DRESSINGS) ×2 IMPLANT
CEMENT BONE REFOBACIN R1X40 US (Cement) ×6 IMPLANT
CHLORAPREP W/TINT 26 (MISCELLANEOUS) ×6 IMPLANT
COMP FEMORAL VNGD KNEE 70MM (Joint) ×3 IMPLANT
COMPONENT FEMORAL VNGD KN 70MM (Joint) IMPLANT
COMPONENT TIB SSKPS 14X79/83 (Miscellaneous) IMPLANT
COVER SURGICAL LIGHT HANDLE (MISCELLANEOUS) ×3 IMPLANT
COVER WAND RF STERILE (DRAPES) IMPLANT
CUFF TOURN SGL QUICK 34 (TOURNIQUET CUFF) ×2
CUFF TRNQT CYL 34X4.125X (TOURNIQUET CUFF) ×1 IMPLANT
DECANTER SPIKE VIAL GLASS SM (MISCELLANEOUS) ×4 IMPLANT
DERMABOND ADVANCED (GAUZE/BANDAGES/DRESSINGS) ×2
DERMABOND ADVANCED .7 DNX12 (GAUZE/BANDAGES/DRESSINGS) ×2 IMPLANT
DRAPE SHEET LG 3/4 BI-LAMINATE (DRAPES) ×9 IMPLANT
DRAPE U-SHAPE 47X51 STRL (DRAPES) ×3 IMPLANT
DRSG AQUACEL AG ADV 3.5X10 (GAUZE/BANDAGES/DRESSINGS) ×3 IMPLANT
DRSG TEGADERM 4X4.75 (GAUZE/BANDAGES/DRESSINGS) ×2 IMPLANT
ELECT BLADE TIP CTD 4 INCH (ELECTRODE) ×3 IMPLANT
ELECT PENCIL ROCKER SW 15FT (MISCELLANEOUS) ×3 IMPLANT
ELECT REM PT RETURN 15FT ADLT (MISCELLANEOUS) ×3 IMPLANT
EVACUATOR 1/8 PVC DRAIN (DRAIN) ×1 IMPLANT
GLOVE BIO SURGEON STRL SZ8.5 (GLOVE) ×6 IMPLANT
GLOVE BIOGEL PI IND STRL 7.0 (GLOVE) ×1 IMPLANT
GLOVE BIOGEL PI IND STRL 7.5 (GLOVE) ×1 IMPLANT
GLOVE BIOGEL PI IND STRL 8.5 (GLOVE) ×1 IMPLANT
GLOVE BIOGEL PI INDICATOR 7.0 (GLOVE) ×2
GLOVE BIOGEL PI INDICATOR 7.5 (GLOVE) ×2
GLOVE BIOGEL PI INDICATOR 8.5 (GLOVE) ×2
GOWN SPEC L3 XXLG W/TWL (GOWN DISPOSABLE) ×3 IMPLANT
GOWN STRL REUS W/TWL XL LVL3 (GOWN DISPOSABLE) ×3 IMPLANT
HANDPIECE INTERPULSE COAX TIP (DISPOSABLE) ×2
HOLDER FOLEY CATH W/STRAP (MISCELLANEOUS) ×2 IMPLANT
HOOD PEEL AWAY FLYTE STAYCOOL (MISCELLANEOUS) ×12 IMPLANT
JET LAVAGE IRRISEPT WOUND (IRRIGATION / IRRIGATOR) ×3
KIT TURNOVER KIT A (KITS) IMPLANT
LAVAGE JET IRRISEPT WOUND (IRRIGATION / IRRIGATOR) ×1 IMPLANT
MANIFOLD NEPTUNE II (INSTRUMENTS) ×3 IMPLANT
MARKER SKIN DUAL TIP RULER LAB (MISCELLANEOUS) ×3 IMPLANT
NDL SPNL 18GX3.5 QUINCKE PK (NEEDLE) ×1 IMPLANT
NEEDLE SPNL 18GX3.5 QUINCKE PK (NEEDLE) ×3 IMPLANT
NS IRRIG 1000ML POUR BTL (IV SOLUTION) ×3 IMPLANT
PADDING CAST COTTON 6X4 STRL (CAST SUPPLIES) IMPLANT
PROTECTOR NERVE ULNAR (MISCELLANEOUS) ×3 IMPLANT
SAW OSC TIP CART 19.5X105X1.3 (SAW) ×3 IMPLANT
SEALER BIPOLAR AQUA 6.0 (INSTRUMENTS) ×3 IMPLANT
SET HNDPC FAN SPRY TIP SCT (DISPOSABLE) ×1 IMPLANT
SET PAD KNEE POSITIONER (MISCELLANEOUS) ×3 IMPLANT
SPONGE DRAIN TRACH 4X4 STRL 2S (GAUZE/BANDAGES/DRESSINGS) ×3 IMPLANT
SPONGE LAP 18X18 RF (DISPOSABLE) IMPLANT
STEM FEM VG 18X80 (Stem) ×2 IMPLANT
STEM FEMORAL VANGUARD 20X120MM (Stem) ×2 IMPLANT
SUT MNCRL AB 3-0 PS2 18 (SUTURE) ×3 IMPLANT
SUT MON AB 2-0 CT1 36 (SUTURE) ×8 IMPLANT
SUT STRATAFIX PDO 1 14 VIOLET (SUTURE) ×2
SUT STRATFX PDO 1 14 VIOLET (SUTURE) ×1
SUT VIC AB 1 CTX 36 (SUTURE) ×4
SUT VIC AB 1 CTX36XBRD ANBCTR (SUTURE) ×2 IMPLANT
SUT VIC AB 2-0 CT1 27 (SUTURE) ×2
SUT VIC AB 2-0 CT1 TAPERPNT 27 (SUTURE) ×1 IMPLANT
SUTURE STRATFX PDO 1 14 VIOLET (SUTURE) ×1 IMPLANT
SWAB COLLECTION DEVICE MRSA (MISCELLANEOUS) ×2 IMPLANT
SWAB CULTURE ESWAB REG 1ML (MISCELLANEOUS) ×2 IMPLANT
TOWER CARTRIDGE SMART MIX (DISPOSABLE) ×4 IMPLANT
TRAY FOLEY MTR SLVR 16FR STAT (SET/KITS/TRAYS/PACK) ×3 IMPLANT
TRAY TIBIAL VANGUARD 79MM REV (Orthopedic Implant) ×2 IMPLANT
VANGD SSK PS TIB 14X79/83 (Miscellaneous) ×3 IMPLANT
WATER STERILE IRR 1000ML POUR (IV SOLUTION) ×5 IMPLANT
WRAP KNEE MAXI GEL POST OP (GAUZE/BANDAGES/DRESSINGS) ×3 IMPLANT
YANKAUER SUCT BULB TIP 10FT TU (MISCELLANEOUS) ×3 IMPLANT

## 2019-04-02 NOTE — Anesthesia Preprocedure Evaluation (Addendum)
Anesthesia Evaluation  Patient identified by MRN, date of birth, ID band Patient awake    Reviewed: Allergy & Precautions, NPO status , Patient's Chart, lab work & pertinent test results  History of Anesthesia Complications Negative for: history of anesthetic complications  Airway Mallampati: I  TM Distance: >3 FB Neck ROM: Full    Dental  (+) Dental Advisory Given, Teeth Intact   Pulmonary Current Smoker,    breath sounds clear to auscultation       Cardiovascular hypertension, Pt. on medications (-) angina Rhythm:Regular Rate:Bradycardia     Neuro/Psych PSYCHIATRIC DISORDERS Anxiety Depression Bipolar Disorder Schizophrenia negative neurological ROS     GI/Hepatic negative GI ROS, (+)     substance abuse  marijuana use, Hepatitis -, C  Endo/Other  negative endocrine ROS  Renal/GU negative Renal ROS     Musculoskeletal  (+) Arthritis ,   Abdominal   Peds  Hematology  Thrombocytopenia    Anesthesia Other Findings   Reproductive/Obstetrics                            Anesthesia Physical Anesthesia Plan  ASA: III  Anesthesia Plan: Spinal   Post-op Pain Management:  Regional for Post-op pain   Induction:   PONV Risk Score and Plan: 1 and Treatment may vary due to age or medical condition and Propofol infusion  Airway Management Planned: Natural Airway and Simple Face Mask  Additional Equipment: None  Intra-op Plan:   Post-operative Plan:   Informed Consent: I have reviewed the patients History and Physical, chart, labs and discussed the procedure including the risks, benefits and alternatives for the proposed anesthesia with the patient or authorized representative who has indicated his/her understanding and acceptance.       Plan Discussed with: CRNA and Anesthesiologist  Anesthesia Plan Comments: (Labs reviewed, platelets acceptable. Discussed risks and benefits of  spinal, including spinal/epidural hematoma, infection, failed block, and PDPH. Patient expressed understanding and wished to proceed. )       Anesthesia Quick Evaluation

## 2019-04-02 NOTE — Progress Notes (Signed)
AssistedDr. Brock with left, ultrasound guided, adductor canal block. Side rails up, monitors on throughout procedure. See vital signs in flow sheet. Tolerated Procedure well.  

## 2019-04-02 NOTE — Transfer of Care (Signed)
Immediate Anesthesia Transfer of Care Note  Patient: Donald Jacobson  Procedure(s) Performed: TOTAL KNEE REVISION (Left )  Patient Location: PACU  Anesthesia Type:Spinal  Level of Consciousness: awake, alert , oriented and patient cooperative  Airway & Oxygen Therapy: Patient Spontanous Breathing and Patient connected to face mask oxygen  Post-op Assessment: Report given to RN and Post -op Vital signs reviewed and stable  Post vital signs: stable  Last Vitals:  Vitals Value Taken Time  BP 113/86 04/02/19 1605  Temp    Pulse 55 04/02/19 1608  Resp 12 04/02/19 1608  SpO2 100 % 04/02/19 1608  Vitals shown include unvalidated device data.  Last Pain:  Vitals:   04/02/19 0933  TempSrc:   PainSc: 0-No pain      Patients Stated Pain Goal: 6 (41/58/30 9407)  Complications: No apparent anesthesia complications

## 2019-04-02 NOTE — Anesthesia Procedure Notes (Signed)
Procedure Name: MAC Date/Time: 04/02/2019 10:41 AM Performed by: Lissa Morales, CRNA Pre-anesthesia Checklist: Patient identified, Emergency Drugs available, Suction available, Patient being monitored and Timeout performed Patient Re-evaluated:Patient Re-evaluated prior to induction Oxygen Delivery Method: Simple face mask Placement Confirmation: positive ETCO2

## 2019-04-02 NOTE — Anesthesia Procedure Notes (Signed)
Anesthesia Regional Block: Adductor canal block   Pre-Anesthetic Checklist: ,, timeout performed, Correct Patient, Correct Site, Correct Laterality, Correct Procedure, Correct Position, site marked, Risks and benefits discussed,  Surgical consent,  Pre-op evaluation,  At surgeon's request and post-op pain management  Laterality: Left  Prep: chloraprep       Needles:  Injection technique: Single-shot  Needle Type: Echogenic Needle     Needle Length: 10cm  Needle Gauge: 21     Additional Needles:   Narrative:  Start time: 04/02/2019 9:28 AM End time: 04/02/2019 9:31 AM Injection made incrementally with aspirations every 5 mL.  Performed by: Personally  Anesthesiologist: Audry Pili, MD  Additional Notes: No pain on injection. No increased resistance to injection. Injection made in 5cc increments. Good needle visualization. Patient tolerated the procedure well.

## 2019-04-02 NOTE — Anesthesia Procedure Notes (Signed)
Spinal  Patient location during procedure: OR Start time: 04/02/2019 10:45 AM End time: 04/02/2019 10:52 AM Staffing Anesthesiologist: Audry Pili, MD Performed: anesthesiologist  Preanesthetic Checklist Completed: patient identified, surgical consent, pre-op evaluation, timeout performed, IV checked, risks and benefits discussed and monitors and equipment checked Spinal Block Patient position: sitting Prep: DuraPrep Patient monitoring: heart rate, cardiac monitor, continuous pulse ox and blood pressure Approach: midline Location: L3-4 Injection technique: single-shot Needle Needle type: Pencan  Needle gauge: 24 G Additional Notes Consent was obtained prior to the procedure with all questions answered and concerns addressed. Risks including, but not limited to, bleeding, infection, nerve damage, paralysis, failed block, inadequate analgesia, allergic reaction, high spinal, itching, and headache were discussed and the patient wished to proceed. Functioning IV was confirmed and monitors were applied. Sterile prep and drape, including hand hygiene, mask, and sterile gloves were used. The patient was positioned and the spine was prepped. The skin was anesthetized with lidocaine. First attempt by CRNA unsuccessful. Next attempt by Dr. Fransisco Beau at lower level successful. Free flow of clear CSF was obtained prior to injecting local anesthetic into the CSF. The spinal needle aspirated freely following injection. The needle was carefully withdrawn. The patient tolerated the procedure well.   Renold Don, MD

## 2019-04-02 NOTE — Op Note (Signed)
OPERATIVE REPORT   04/02/2019  3:17 PM  PATIENT:  Donald Jacobson   SURGEON:  Bertram Savin, MD  ASSISTANT:  Georgann Housekeeper, RNFA.   PREOPERATIVE DIAGNOSIS:  Failed left total knee arthroplasty.  POSTOPERATIVE DIAGNOSIS:  Same.  PROCEDURE:  Revision left total knee arthroplasty, femoral and tibial components.  ANESTHESIA:   GETA. Adductor canal block.  ANTIBIOTICS: 2 g Ancef.  IMPLANTS: Biomet 360 tibial tray size 79 mm with large cruciate wing, 5 mm offset adapter, and 18 x 80 mm splined stem. Biomet 360 left femoral component size 70 mm with 20 x 120 mm splined stem. 14 mm ArCom PS tibial bearing.  EXPLANTS: [employer] Oxinium CR femoral component size 7 left. Size 6 fixed-bearing tibial tray. 9 mm CR poly-insert.  SPECIMENS: Left knee synovial fluid for Gram stain and culture.  COMPLICATIONS: None.  DISPOSITION: Stable to PACU.  SURGICAL INDICATIONS:  Donald Jacobson is a 58 y.o. 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  He was indicated for revision left total knee arthroplasty.  Preoperative work-up including aspiration was negative for infection.  The risks, benefits, and alternatives were discussed with the patient. There are risks associated with the surgery including, but not limited to, problems with anesthesia (death), infection, instability (giving out of the joint), dislocation, differences in leg length/angulation/rotation, fracture of bones, loosening or failure of implants, hematoma (blood accumulation) which may require surgical drainage, blood clots, pulmonary embolism, nerve injury (foot drop and lateral thigh numbness), and blood vessel injury. The patient understands these risks and elects to proceed.  PROCEDURE IN DETAIL: The patient was identified in the holding area using 2 identifiers.  The surgical site was marked by myself.  Regional block was placed by anesthesia.  He was  taken to the operating room, placed supine on the operating room table.  General anesthesia was induced.  Nonsterile tourniquet was applied to the left upper thigh.  The left lower extremity was prepped and draped in the normal sterile surgical fashion.  Timeout was called, verifying site and site of surgery.  The lower extremity was exsanguinated with an Esmarch, and the tourniquet was elevated to 320 mmHg.  I used a 10 blade to sharply excise his previous anterior knee scar.  Full-thickness skin flaps were created.  I made a standard medial parapatellar arthrotomy.  The joint fluid was clear and benign appearing.  Joint fluid was sent for Gram stain with culture.  The knee was brought into full extension.  I performed a radical synovectomy of the medial gutter, lateral gutter, and suprapatellar pouch.  Bovie electrocautery was used to sharply debride the infrapatellar scar.  Please note that exposure was extremely difficult due to the patient's pre-existing range of motion.  I examined the knee.  The femoral component was well fixed.  There was fractured cement underneath the tibial tray.  The tibial component was internally rotated.  Osteotome was used to remove the polyliner.  I used an ACL saw blade to disrupt the interface between the cement and femoral component.  I then used flexible osteotomes.  The femoral component was moved without any significant bone loss.  The tibial component was removed the same way.  The rotation of the femur was excellent.  The flexion and extension gaps were matched.  I then assessed the patellar component, which was well fixed.  I elected to retain the patellar component.  There were no significant bone defects.  There was some mild softening of the bone under the medial tibial tray.  I used the entry drill to gain access to the medullary canals of the proximal tibia and distal femur.  I reamed up sequentially to an 18 mm reamer on the proximal tibia, and a 20 mm reamer on  the distal femur.  I freshen the proximal tibia cut using an intramedullary cutting guide.  I sized the tibial component and determined the appropriate offset.  The proximal tibia surface was prepared for a large cruciate wing.  The trial tibial component was assembled on the back table and impacted into the bone with excellent fit.  Trial femoral component was assembled and impacted.  The box cut was freshened.  Trial polyliner was was placed.  The knee was stable throughout the range of motion.  There was excellent varus/valgus balance.  The flexion and extension gaps were well-balanced.  I then let the tourniquet down.  The trial components were removed.  The real components were opened and assembled on the back table.  After allowing 20 minutes, the tourniquet was reinflated.  The sclerotic bone proximal tibia was drilled with a drill pin.  The bony surfaces were irrigated with pulse lavage and then dried.  She had a of stone in her kidney and then the tibial and then femoral components were cemented into place using hybrid fixation technique.  Trial liner was placed and the knee was brought into full extension.  Excess cement was cleared.  Once the cement was fully hardened, the trial polyliner was exchanged for the real liner and secured with a locking bar.  Stability testing was performed to 1 additional time and found to be unchanged and excellent.  The tourniquet was let down.  Meticulous hemostasis was achieved.  The knee was irrigated with Irrisept solution followed by normal saline using pulse lavage.  The arthrotomy was closed over a medium Hemovac drain with #1 Vicryl and #1 strata fix suture.  Deep dermal layer was closed with 2-0 Monocryl suture.  Skin was reapproximated with staples.  Dermabond was applied.  Once the glue was fully hardened, an Aquacel dressing was applied.  The patient was then awakened and transferred to the PACU in stable condition.  Sponge, needle, and instrument counts were  correct at the end of the case x2.  There were no known complications.  POSTOPERATIVE PLAN: Postoperatively, the patient be admitted to the hospital.  Weight-bear as tolerated left lower extremity with a walker.  Begin physical therapy for out of bed mobilization and range of motion to the left knee.  Begin Eliquis for DVT prophylaxis.  Follow intraoperative culture.  Plan for discharge home when he has cleared physical therapy.  Return to the office for routine two-week postoperative care.
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
## 2019-04-02 NOTE — Discharge Instructions (Signed)
° °Dr. Brian Swinteck °Total Joint Specialist °[facility] °[address] °[place],  [address] °[phone] ° °TOTAL KNEE REPLACEMENT POSTOPERATIVE DIRECTIONS ° ° ° °Knee Rehabilitation, Guidelines Following Surgery  °Results after knee surgery are often greatly improved when you follow the exercise, range of motion and muscle strengthening exercises prescribed by your doctor. Safety measures are also important to protect the knee from further injury. Any time any of these exercises cause you to have increased pain or swelling in your knee joint, decrease the amount until you are comfortable again and slowly increase them. If you have problems or questions, call your caregiver or physical therapist for advice.  ° °WEIGHT BEARING °Weight bearing as tolerated with assist device (walker, cane, etc) as directed, use it as long as suggested by your surgeon or therapist, typically at least 4-6 weeks. ° °HOME CARE INSTRUCTIONS  °Remove items at home which could result in a fall. This includes throw rugs or furniture in walking pathways.  °Continue medications as instructed at time of discharge. °You may have some home medications which will be placed on hold until you complete the course of blood thinner medication.  °You may start showering once you are discharged home but do not submerge the incision under water. Just pat the incision dry and apply a dry gauze dressing on daily. °Walk with walker as instructed.  °You may resume a sexual relationship in one month or when given the OK by your doctor.  °· Use walker as long as suggested by your caregivers. °· Avoid periods of inactivity such as sitting longer than an hour when not asleep. This helps prevent blood clots.  °You may put full weight on your legs and walk as much as is comfortable.  °You may return to work once you are cleared by your doctor.  °Do not drive a car for 6 weeks or until released by you surgeon.  °· Do not drive  while taking narcotics.  °Wear the elastic stockings for three weeks following surgery during the day but you may remove then at night. °Make sure you keep all of your appointments after your operation with all of your doctors and caregivers. You should call the office at the above phone number and make an appointment for approximately two weeks after the date of your surgery. °Do not remove your surgical dressing. The dressing is waterproof; you may take showers in 3 days, but do not take tub baths or submerge the dressing. °Please pick up a stool softener and laxative for home use as long as you are requiring pain medications. °· ICE to the affected knee every three hours for 30 minutes at a time and then as needed for pain and swelling.  Continue to use ice on the knee for pain and swelling from surgery. You may notice swelling that will progress down to the foot and ankle.  This is normal after surgery.  Elevate the leg when you are not up walking on it.   °It is important for you to complete the blood thinner medication as prescribed by your doctor. °· Continue to use the breathing machine which will help keep your temperature down.  It is common for your temperature to cycle up and down following surgery, especially at night when you are not up moving around and exerting yourself.  The breathing machine keeps your lungs expanded and your temperature down. ° °RANGE OF MOTION AND STRENGTHENING EXERCISES  °Rehabilitation of the knee is important following   a knee injury or an operation. After just a few days of immobilization, the muscles of the thigh which control the knee become weakened and shrink (atrophy). Knee exercises are designed to build up the tone and strength of the thigh muscles and to improve knee motion. Often times heat used for twenty to thirty minutes before working out will loosen up your tissues and help with improving the range of motion but do not use heat for the first two weeks following  surgery. These exercises can be done on a training (exercise) mat, on the floor, on a table or on a bed. Use what ever works the best and is most comfortable for you Knee exercises include:  °Leg Lifts - While your knee is still immobilized in a splint or cast, you can do straight leg raises. Lift the leg to 60 degrees, hold for 3 sec, and slowly lower the leg. Repeat 10-20 times 2-3 times daily. Perform this exercise against resistance later as your knee gets better.  °Quad and Hamstring Sets - Tighten up the muscle on the front of the thigh (Quad) and hold for 5-10 sec. Repeat this 10-20 times hourly. Hamstring sets are done by pushing the foot backward against an object and holding for 5-10 sec. Repeat as with quad sets.  °A rehabilitation program following serious knee injuries can speed recovery and prevent re-injury in the future due to weakened muscles. Contact your doctor or a physical therapist for more information on knee rehabilitation.  ° °SKILLED REHAB INSTRUCTIONS: °If the patient is transferred to a skilled rehab facility following release from the hospital, a list of the current medications will be sent to the facility for the patient to continue.  When discharged from the skilled rehab facility, please have the facility set up the patient's Home Health Physical Therapy prior to being released. Also, the skilled facility will be responsible for providing the patient with their medications at time of release from the facility to include their pain medication, the muscle relaxants, and their blood thinner medication. If the patient is still at the rehab facility at time of the two week follow up appointment, the skilled rehab facility will also need to assist the patient in arranging follow up appointment in our office and any transportation needs. ° °MAKE SURE YOU:  °Understand these instructions.  °Will watch your condition.  °Will get help right away if you are not doing well or get worse.   ° ° °Pick up stool softner and laxative for home use following surgery while on pain medications. °Do NOT remove your dressing. You may shower.  °Do not take tub baths or submerge incision under water. °May shower starting three days after surgery. °Please use a clean towel to pat the incision dry following showers. °Continue to use ice for pain and swelling after surgery. °Do not use any lotions or creams on the incision until instructed by your surgeon. ° °Information on my medicine - ELIQUIS® (apixaban) ° °This medication education was reviewed with me or my healthcare representative as part of my discharge preparation.  The pharmacist that spoke with me during my hospital stay was:   ° °Why was Eliquis® prescribed for you? °Eliquis® was prescribed for you to reduce the risk of blood clots forming after orthopedic surgery.   ° °What do You need to know about Eliquis®? °Take your Eliquis® TWICE DAILY - one tablet in the morning and one tablet in the evening with or without food.  It would   be best to take the dose about the same time each day. ° °If you have difficulty swallowing the tablet whole please discuss with your pharmacist how to take the medication safely. ° °Take Eliquis® exactly as prescribed by your doctor and DO NOT stop taking Eliquis® without talking to the doctor who prescribed the medication.  Stopping without other medication to take the place of Eliquis® may increase your risk of developing a clot. ° °After discharge, you should have regular check-up appointments with your healthcare provider that is prescribing your Eliquis®. ° °What do you do if you miss a dose? °If a dose of ELIQUIS® is not taken at the scheduled time, take it as soon as possible on the same day and twice-daily administration should be resumed.  The dose should not be doubled to make up for a missed dose.  Do not take more than one tablet of ELIQUIS at the same time. ° °Important Safety Information °A possible side effect of  Eliquis® is bleeding. You should call your healthcare provider right away if you experience any of the following: °? Bleeding from an injury or your nose that does not stop. °? Unusual colored urine (red or dark brown) or unusual colored stools (red or black). °? Unusual bruising for unknown reasons. °? A serious fall or if you hit your head (even if there is no bleeding). ° °Some medicines may interact with Eliquis® and might increase your risk of bleeding or clotting while on Eliquis®. To help avoid this, consult your healthcare provider or pharmacist prior to using any new prescription or non-prescription medications, including herbals, vitamins, non-steroidal anti-inflammatory drugs (NSAIDs) and supplements. ° °This website has more information on Eliquis® (apixaban): http://www.eliquis.com/eliquis/home ° ° °

## 2019-04-03 LAB — BASIC METABOLIC PANEL
Anion gap: 7 (ref 5–15)
BUN: 25 mg/dL — ABNORMAL HIGH (ref 6–20)
CO2: 24 mmol/L (ref 22–32)
Calcium: 8.4 mg/dL — ABNORMAL LOW (ref 8.9–10.3)
Chloride: 102 mmol/L (ref 98–111)
Creatinine, Ser: 1.11 mg/dL (ref 0.61–1.24)
GFR calc Af Amer: >60 mL/min (ref >60)
GFR calc non Af Amer: >60 mL/min (ref >60)
Glucose, Bld: 163 mg/dL — ABNORMAL HIGH (ref 70–99)
Potassium: 4 mmol/L (ref 3.5–5.1)
Sodium: 133 mmol/L — ABNORMAL LOW (ref 135–145)

## 2019-04-03 LAB — CBC
HCT: 33.1 % — ABNORMAL LOW (ref 39.0–52.0)
Hemoglobin: 11.1 g/dL — ABNORMAL LOW (ref 13.0–17.0)
MCH: 32.1 pg (ref 26.0–34.0)
MCHC: 33.5 g/dL (ref 30.0–36.0)
MCV: 95.7 fL (ref 80.0–100.0)
Platelets: 92 10*3/uL — ABNORMAL LOW (ref 150–400)
RBC: 3.46 MIL/uL — ABNORMAL LOW (ref 4.22–5.81)
RDW: 13.2 % (ref 11.5–15.5)
WBC: 11.5 10*3/uL — ABNORMAL HIGH (ref 4.0–10.5)
nRBC: 0 % (ref 0.0–0.2)

## 2019-04-03 MED ORDER — ONDANSETRON HCL 4 MG PO TABS: 4.0000 mg | ORAL_TABLET | Freq: Four times a day (QID) | ORAL | 0 refills | Status: AC | PRN

## 2019-04-03 MED ORDER — DOCUSATE SODIUM 100 MG PO CAPS: 100.0000 mg | ORAL_CAPSULE | Freq: Two times a day (BID) | ORAL | 1 refills | Status: AC

## 2019-04-03 MED ORDER — APIXABAN 2.5 MG PO TABS: 2.5000 mg | ORAL_TABLET | Freq: Two times a day (BID) | ORAL | 0 refills | Status: AC

## 2019-04-03 MED ORDER — HYDROCODONE-ACETAMINOPHEN 5-325 MG PO TABS: 1.0000 | ORAL_TABLET | ORAL | 0 refills | Status: AC | PRN

## 2019-04-03 MED ORDER — SENNA 8.6 MG PO TABS: 1.0000 | ORAL_TABLET | Freq: Two times a day (BID) | ORAL | 0 refills | Status: AC

## 2019-04-03 NOTE — Discharge Summary (Signed)
Physician Discharge Summary  Patient ID: Donald Jacobson MRN: [id number] DOB/AGE: Nov 24, 1960 58 y.o.  Admit date: 04/02/2019 Discharge date: 04/03/2019  Admission Diagnoses:  Failed total knee, left [facility](HCC)  Discharge Diagnoses:  Principal Problem:   Failed total knee, left (HCC) Active Problems:   Failed total knee, left, initial encounter [facility](HCC)   Past Medical History:  Diagnosis Date  . Anxiety   . Arthritis   . Bipolar disorder (HCC)   . Depression   . Hepatitis C    TOOK TX FOR FEW YEARS AGO  . Hypertension   . Schizophrenia (HCC)     Surgeries: Procedure(s): TOTAL KNEE REVISION on 04/02/2019   Consultants (if any):   Discharged Condition: Improved  Hospital Course: Donald Jacobson is an 58 y.o. male who was admitted 04/02/2019 with a diagnosis of Failed total knee, left (HCC) and went to the operating room on 04/02/2019 and underwent the above named procedures.    He was given perioperative antibiotics:  Anti-infectives (From admission, onward)   Start     Dose/Rate Route Frequency Ordered Stop   04/02/19 1800  ceFAZolin (ANCEF) IVPB 2g/100 mL premix     2 g 200 mL/hr over 30 Minutes Intravenous Every 6 hours 04/02/19 1711 04/03/19 0153   04/02/19 0800  ceFAZolin (ANCEF) IVPB 2g/100 mL premix     2 g 200 mL/hr over 30 Minutes Intravenous On call to O.R. 04/02/19 0759 04/02/19 1045    .  He was given sequential compression devices, early ambulation, and eliquis for DVT prophylaxis.  He benefited maximally from the hospital stay and there were no complications.    Recent vital signs:  Vitals:   04/03/19 0820 04/03/19 1007  BP: (!) 145/77 139/77  Pulse:  (!) 55  Resp:  16  Temp:    SpO2:  100%    Recent laboratory studies:  Lab Results  Component Value Date   HGB 11.1 (L) 04/03/2019   HGB 14.5 03/24/2019   HGB 14.7 03/10/2019   Lab Results  Component Value Date   WBC 11.5 (H) 04/03/2019   PLT 92 (L) 04/03/2019   No results found for: INR Lab  Results  Component Value Date   NA 133 (L) 04/03/2019   K 4.0 04/03/2019   CL 102 04/03/2019   CO2 24 04/03/2019   BUN 25 (H) 04/03/2019   CREATININE 1.11 04/03/2019   GLUCOSE 163 (H) 04/03/2019    Discharge Medications:   Allergies as of 04/03/2019      Reactions   Contrast Media [iodinated Diagnostic Agents] Nausea And Vomiting, Other (See Comments)   syncope      Medication List    TAKE these medications   apixaban 2.5 MG Tabs tablet Commonly known as: ELIQUIS Take 1 tablet (2.5 mg total) by mouth every 12 (twelve) hours.   busPIRone 10 MG tablet Commonly known as: BUSPAR Take 10 mg by mouth as needed.   docusate sodium 100 MG capsule Commonly known as: COLACE Take 1 capsule (100 mg total) by mouth 2 (two) times daily.   hydrochlorothiazide 25 MG tablet Commonly known as: HYDRODIURIL Take 25 mg by mouth daily.   HYDROcodone-acetaminophen 5-325 MG tablet Commonly known as: NORCO/VICODIN Take 1 tablet by mouth every 4 (four) hours as needed for moderate pain (pain score 4-6).   Latuda 60 MG Tabs Generic drug: Lurasidone HCl Take 60 mg by mouth daily.   multivitamin with minerals Tabs tablet Take 1 tablet by mouth daily.   ondansetron 4 MG tablet  Commonly known as: ZOFRAN Take 1 tablet (4 mg total) by mouth every 6 (six) hours as needed for nausea.   prazosin 2 MG capsule Commonly known as: MINIPRESS Take 2 mg by mouth as needed.   senna 8.6 MG Tabs tablet Commonly known as: SENOKOT Take 1 tablet (8.6 mg total) by mouth 2 (two) times daily.   triamcinolone cream 0.1 % Commonly known as: KENALOG Apply 1 application topically 2 (two) times daily as needed (rashes).       Diagnostic Studies: Dg Knee Left Port  Result Date: 04/02/2019 CLINICAL DATA:  Status post left total knee arthroplasty EXAM: PORTABLE LEFT KNEE - 1-2 VIEW COMPARISON:  06/01/2018 FINDINGS: Interval postoperative findings of revision left knee total arthroplasty, with expected  appearance of components. No evidence of perihardware fracture. Expected overlying postoperative changes anteriorly. IMPRESSION: Interval postoperative findings of revision left knee total arthroplasty, with expected appearance of components. No evidence of perihardware fracture. Expected overlying postoperative changes anteriorly. Electronically Signed   By: Eddie Candle M.D.   On: 04/02/2019 16:57    Disposition: Discharge disposition: 01-Home or Self Care       Discharge Instructions    Call MD / Call 911   Complete by: As directed    If you experience chest pain or shortness of breath, CALL 911 and be transported to the hospital emergency room.  If you develope a fever above 101 F, pus (white drainage) or increased drainage or redness at the wound, or calf pain, call your surgeon's office.   Constipation Prevention   Complete by: As directed    Drink plenty of fluids.  Prune juice may be helpful.  You may use a stool softener, such as Colace (over the counter) 100 mg twice a day.  Use MiraLax (over the counter) for constipation as needed.   Diet - low sodium heart healthy   Complete by: As directed    Driving restrictions   Complete by: As directed    No driving for 4 weeks   Increase activity slowly as tolerated   Complete by: As directed    Lifting restrictions   Complete by: As directed    No lifting for 6 weeks      Follow-up Information    Denesia Donelan, Aaron Edelman, MD. Schedule an appointment as soon as possible for a visit in 2 weeks.   Specialty: Orthopedic Surgery Why: For suture removal, For wound re-check Contact information: [address] [place] [address] [phone]            Signed: Hilton Cork Lamees Gable 04/03/2019, 4:35 PM

## 2019-04-03 NOTE — Progress Notes (Signed)
Physical Therapy Treatment Patient Details Name: Donald Jacobson MRN: [id number] DOB: 08/17/1961 Today's Date: 04/03/2019    History of Present Illness Pt s/p L TKR revision and iwth hx of back surgery, bipolar, and schizoprenia    PT Comments    Pt progressing well with mobility and reviewed home therex program with written instruction provided.  Pt cautioned regarding over-doing initially - pt was attempting to do single leg squats on L LE using RW on initial standing.   Follow Up Recommendations  Home health PT;Follow surgeon's recommendation for DC plan and follow-up therapies     Equipment Recommendations  Rolling walker with 5" wheels;3in1 (PT)    Recommendations for Other Services       Precautions / Restrictions Precautions Precautions: Fall;Knee Restrictions Weight Bearing Restrictions: No Other Position/Activity Restrictions: WBAT    Mobility  Bed Mobility Overal bed mobility: Needs Assistance Bed Mobility: Supine to Sit     Supine to sit: Supervision        Transfers Overall transfer level: Needs assistance Equipment used: Rolling walker (2 wheeled) Transfers: Sit to/from Stand Sit to Stand: Min guard;Supervision         General transfer comment: cues or LE management and use of UEs to self assist  Ambulation/Gait Ambulation/Gait assistance: Min guard;Supervision Gait Distance (Feet): 200 Feet Assistive device: Rolling walker (2 wheeled) Gait Pattern/deviations: Step-to pattern;Step-through pattern;Decreased step length - right;Decreased step length - left;Shuffle;Trunk flexed     General Gait Details: cues for posture, position from RW and initial sequence   Stairs             Wheelchair Mobility    Modified Rankin (Stroke Patients Only)       Balance Overall balance assessment: Needs assistance Sitting-balance support: No upper extremity supported;Feet supported Sitting balance-Leahy Scale: Good     Standing balance  support: No upper extremity supported Standing balance-Leahy Scale: Fair                              Cognition Arousal/Alertness: Awake/alert Behavior During Therapy: WFL for tasks assessed/performed;Impulsive Overall Cognitive Status: Within Functional Limits for tasks assessed                                        Exercises Total Joint Exercises Ankle Circles/Pumps: AROM;Both;15 reps;Supine Quad Sets: AROM;Both;10 reps;Supine Heel Slides: AAROM;Left;15 reps;Supine Straight Leg Raises: AROM;Left;10 reps;Supine Long Arc Quad: AAROM;AROM;Left;10 reps;Seated Knee Flexion: AROM;AAROM;Left;10 reps;Seated    General Comments        Pertinent Vitals/Pain Pain Assessment: 0-10 Pain Score: 5  Pain Location: L knee Pain Descriptors / Indicators: Aching;Sore Pain Intervention(s): Limited activity within patient's tolerance;Monitored during session;Premedicated before session    Home Living Family/patient expects to be discharged to:: Private residence Living Arrangements: Spouse/significant other Available Help at Discharge: Family Type of Home: House Home Access: Ramped entrance   Home Layout: One level Home Equipment: Environmental consultantWalker - 2 wheels;Bedside commode      Prior Function Level of Independence: Independent          PT Goals (current goals can now be found in the care plan section) Acute Rehab PT Goals Patient Stated Goal: Regain IND PT Goal Formulation: With patient Time For Goal Achievement: 04/17/19 Potential to Achieve Goals: Good Progress towards PT goals: Progressing toward goals    Frequency    7X/week  PT Plan Current plan remains appropriate    Co-evaluation              AM-PAC PT "6 Clicks" Mobility   Outcome Measure  Help needed turning from your back to your side while in a flat bed without using bedrails?: None Help needed moving from lying on your back to sitting on the side of a flat bed without using  bedrails?: None Help needed moving to and from a bed to a chair (including a wheelchair)?: A Little Help needed standing up from a chair using your arms (e.g., wheelchair or bedside chair)?: A Little Help needed to walk in hospital room?: A Little Help needed climbing 3-5 steps with a railing? : A Little 6 Click Score: 20    End of Session Equipment Utilized During Treatment: Gait belt Activity Tolerance: Patient tolerated treatment well Patient left: in chair;with call bell/phone within reach;with chair alarm set Nurse Communication: Mobility status PT Visit Diagnosis: Difficulty in walking, not elsewhere classified (R26.2)     Time: 9201-0071 PT Time Calculation (min) (ACUTE ONLY): 30 min  Charges:  $Gait Training: 8-22 mins $Therapeutic Exercise: 8-22 mins                     [place] Pager [phone] Office [phone]    Ceirra Belli 04/03/2019, 2:58 PM

## 2019-04-03 NOTE — Anesthesia Postprocedure Evaluation (Signed)
Anesthesia Post Note  Patient: Donald Jacobson  Procedure(s) Performed: TOTAL KNEE REVISION (Left )     Patient location during evaluation: PACU Anesthesia Type: Spinal Level of consciousness: oriented and awake and alert Pain management: pain level controlled Vital Signs Assessment: post-procedure vital signs reviewed and stable Respiratory status: spontaneous breathing, respiratory function stable and patient connected to nasal cannula oxygen Cardiovascular status: blood pressure returned to baseline and stable Postop Assessment: no headache, no backache and no apparent nausea or vomiting Anesthetic complications: no    Last Vitals:  Vitals:   04/03/19 0820 04/03/19 1007  BP: (!) 145/77 139/77  Pulse:  (!) 55  Resp:  16  Temp:    SpO2:  100%    Last Pain:  Vitals:   04/03/19 1320  TempSrc:   PainSc: [place]

## 2019-04-03 NOTE — Progress Notes (Signed)
Subjective:  Patient reports pain as mild to moderate.  Denies N/V/CP/SOB. Wants to go home.  Objective:   VITALS:   Vitals:   04/03/19 0124 04/03/19 0545 04/03/19 0820 04/03/19 1007  BP: 139/75 135/77 (!) 145/77 139/77  Pulse: (!) 55 (!) 52  (!) 55  Resp: 18 18  16   Temp: 98.6 F (37 C) 97.8 F (36.6 C)    TempSrc: Oral     SpO2: 100% 100%  100%  Weight:      Height:        NAD ABD soft Sensation intact distally Intact pulses distally Dorsiflexion/Plantar flexion intact Incision: dressing C/D/I Compartment soft HV ss  Lab Results  Component Value Date   WBC 11.5 (H) 04/03/2019   HGB 11.1 (L) 04/03/2019   HCT 33.1 (L) 04/03/2019   MCV 95.7 04/03/2019   PLT 92 (L) 04/03/2019   BMET    Component Value Date/Time   NA 133 (L) 04/03/2019 0240   K 4.0 04/03/2019 0240   CL 102 04/03/2019 0240   CO2 24 04/03/2019 0240   GLUCOSE 163 (H) 04/03/2019 0240   BUN 25 (H) 04/03/2019 0240   CREATININE 1.11 04/03/2019 0240   CALCIUM 8.4 (L) 04/03/2019 0240   GFRNONAA >60 04/03/2019 0240   GFRAA >60 04/03/2019 0240    Recent Results (from the past 240 hour(s))  Surgical pcr screen     Status: None   Collection Time: 03/24/19  2:51 PM   Specimen: Nasal Mucosa; Nasal Swab  Result Value Ref Range Status   MRSA, PCR NEGATIVE NEGATIVE Final   Staphylococcus aureus NEGATIVE NEGATIVE Final    Comment: (NOTE) The Xpert SA Assay (FDA approved for NASAL specimens in patients 60 years of age and older), is one component of a comprehensive surveillance program. It is not intended to diagnose infection nor to guide or monitor treatment. Performed at [facility], [address] [place], [place] [address]   SARS Coronavirus 2 (Performed in [facility] hospital lab)     Status: None   Collection Time: 03/30/19  9:18 AM   Specimen: Nasal Swab  Result Value Ref Range Status   SARS Coronavirus 2 NEGATIVE NEGATIVE Final    Comment: (NOTE) SARS-CoV-2  target nucleic acids are NOT DETECTED. The SARS-CoV-2 RNA is generally detectable in upper and lower respiratory specimens during the acute phase of infection. Negative results do not preclude SARS-CoV-2 infection, do not rule out co-infections with other pathogens, and should not be used as the sole basis for treatment or other patient management decisions. Negative results must be combined with clinical observations, patient history, and epidemiological information. The expected result is Negative. Fact Sheet for Patients: HairSlick.nohttps://www.fda.gov/media/138098/download Fact Sheet for Healthcare Providers: quierodirigir.comhttps://www.fda.gov/media/138095/download This test is not yet approved or cleared by the [place] FDA and  has been authorized for detection and/or diagnosis of SARS-CoV-2 by FDA under an Emergency Use Authorization (EUA). This EUA will remain  in effect (meaning this test can be used) for the duration of the COVID-19 declaration under Section 56 4(b)(1) of the Act, 21 U.S.C. section 360bbb-3(b)(1), unless the authorization is terminated or revoked sooner. Performed at [facility], [address] [place], [place] [address]   Aerobic/Anaerobic Culture (surgical/deep wound)     Status: None (Preliminary result)   Collection Time: 04/02/19 11:45 AM   Specimen: Joint, Other; Body Fluid  Result Value Ref Range Status   Specimen Description   Final    KNEE  LEFT Performed at [facility], [place] [address] [place], [place] [address]    Special Requests   Final    NONE Performed at [facility], [place] [address] [place], [place] [address]    Gram Stain   Final    FEW WBC SEEN NO ORGANISMS SEEN Gram Stain Report Called to,Read Back By and Verified With: Karle Starch  [id number] @ 4270 BY J SCOTTON Performed at [facility], [place] [address] [place], [place] [address]    Culture   Final    NO GROWTH < 24 HOURS Performed at  [place] [address], [place], [place] [address]    Report Status PENDING  Incomplete     Assessment/Plan: 1 Day Post-Op   Principal Problem:   Failed total knee, left ([place]) Active Problems:   Failed total knee, left, initial encounter ([place])   WBAT with walker DVT ppx: eliquis, SCDs, TEDS PO pain control PT/OT Dispo: D/C HV drain, D/C home with HHPT   Hilton Cork Marnette Perkins 04/03/2019, 11:19 AM   Rod Can, MD Cell: [phone] [place] is now [facility]  [place] [address] [place], [place], [place] [address] Phone: [phone] [email or web address] Facebook  [employer]

## 2019-04-03 NOTE — TOC Progression Note (Signed)
Transition of Care [facility]) - Progression Note    Patient Details  Name: Estus Krakowski MRN: [id number] Date of Birth: 06-27-1961  [facility] of [facility] CM/SW Contact  Leeroy Cha, RN Phone Number: 04/03/2019, 1:38 PM tct-Mike with [facility] payor source to [place] and [facility] will accept case once auth number is obtained/tct-DEanne with [employer] will call Ronalee Belts with Josem Kaufmann number.        Expected Discharge Plan and Services           Expected Discharge Date: 04/03/19                                     Social Determinants of Health (SDOH) Interventions    Readmission Risk Interventions No flowsheet data found.

## 2019-04-03 NOTE — Evaluation (Signed)
Physical Therapy Evaluation Patient Details Name: Donald Jacobson MRN: [id number] DOB: 1961-03-09 Today's Date: 04/03/2019   History of Present Illness  Pt s/p L TKR revision and iwth hx of back surgery, bipolar, and schizoprenia  Clinical Impression  Pt s/p L TKR and presents with decreased L LE strength/ROM and post op pain limiting functional mobility.  Pt should progress to dc home with family assist.    Follow Up Recommendations Home health PT;Follow surgeon's recommendation for DC plan and follow-up therapies    Equipment Recommendations  Rolling walker with 5" wheels;3in1 (PT)    Recommendations for Other Services       Precautions / Restrictions Precautions Precautions: Fall;Knee Restrictions Weight Bearing Restrictions: No Other Position/Activity Restrictions: WBAT      Mobility  Bed Mobility Overal bed mobility: Needs Assistance Bed Mobility: Supine to Sit     Supine to sit: Supervision        Transfers Overall transfer level: Needs assistance Equipment used: Rolling walker (2 wheeled) Transfers: Sit to/from Stand Sit to Stand: Min guard         General transfer comment: cues or LE management and use of UEs to self assist  Ambulation/Gait Ambulation/Gait assistance: Min assist;Min guard Gait Distance (Feet): 200 Feet Assistive device: Rolling walker (2 wheeled) Gait Pattern/deviations: Step-to pattern;Step-through pattern;Decreased step length - right;Decreased step length - left;Shuffle;Trunk flexed     General Gait Details: cues for posture, position from RW and initial sequence  Stairs            Wheelchair Mobility    Modified Rankin (Stroke Patients Only)       Balance Overall balance assessment: Needs assistance Sitting-balance support: No upper extremity supported;Feet supported Sitting balance-Leahy Scale: Good     Standing balance support: No upper extremity supported Standing balance-Leahy Scale: Fair                               Pertinent Vitals/Pain Pain Assessment: 0-10 Pain Score: 5  Pain Location: L knee Pain Descriptors / Indicators: Aching;Sore Pain Intervention(s): Limited activity within patient's tolerance;Monitored during session;Premedicated before session;Ice applied    Home Living Family/patient expects to be discharged to:: Private residence Living Arrangements: Spouse/significant other Available Help at Discharge: Family Type of Home: House Home Access: Ramped entrance     Home Layout: One level Home Equipment: Environmental consultantWalker - 2 wheels;Bedside commode      Prior Function Level of Independence: Independent               Hand Dominance        Extremity/Trunk Assessment   Upper Extremity Assessment Upper Extremity Assessment: Overall WFL for tasks assessed    Lower Extremity Assessment Lower Extremity Assessment: LLE deficits/detail LLE Deficits / Details: 3/5 quads with IND SLR;  AAROM -5 - 40    Cervical / Trunk Assessment Cervical / Trunk Assessment: Normal  Communication   Communication: No difficulties  Cognition Arousal/Alertness: Awake/alert Behavior During Therapy: WFL for tasks assessed/performed;Impulsive Overall Cognitive Status: Within Functional Limits for tasks assessed                                        General Comments      Exercises Total Joint Exercises Ankle Circles/Pumps: AROM;Both;15 reps;Supine Quad Sets: AROM;Both;10 reps;Supine Heel Slides: AAROM;Left;15 reps;Supine Straight Leg Raises: AROM;Left;10 reps;Supine   Assessment/Plan  PT Assessment Patient needs continued PT services  PT Problem List Decreased strength;Decreased range of motion;Decreased activity tolerance;Decreased mobility;Decreased knowledge of use of DME;Pain       PT Treatment Interventions DME instruction;Gait training;Stair training;Functional mobility training;Therapeutic activities;Therapeutic exercise;Patient/family  education    PT Goals (Current goals can be found in the Care Plan section)  Acute Rehab PT Goals Patient Stated Goal: Regain IND PT Goal Formulation: With patient Time For Goal Achievement: 04/17/19 Potential to Achieve Goals: Good    Frequency 7X/week   Barriers to discharge        Co-evaluation               AM-PAC PT "6 Clicks" Mobility  Outcome Measure Help needed turning from your back to your side while in a flat bed without using bedrails?: None Help needed moving from lying on your back to sitting on the side of a flat bed without using bedrails?: None Help needed moving to and from a bed to a chair (including a wheelchair)?: A Little Help needed standing up from a chair using your arms (e.g., wheelchair or bedside chair)?: A Little Help needed to walk in hospital room?: A Little Help needed climbing 3-5 steps with a railing? : A Little 6 Click Score: 20    End of Session Equipment Utilized During Treatment: Gait belt Activity Tolerance: Patient tolerated treatment well Patient left: in chair;with call bell/phone within reach;with chair alarm set Nurse Communication: Mobility status PT Visit Diagnosis: Difficulty in walking, not elsewhere classified (R26.2)    Time: 1102-1130 PT Time Calculation (min) (ACUTE ONLY): 28 min   Charges:   PT Evaluation $PT Eval Low Complexity: 1 Low PT Treatments $Therapeutic Exercise: 8-22 mins        Debe Coder PT Acute Rehabilitation Services Pager [phone] Office [phone]   Patrece Tallie 04/03/2019, 12:13 PM

## 2019-04-03 NOTE — Plan of Care (Signed)
Plan of care reviewed and discussed with the patient. 

## 2019-04-07 ENCOUNTER — Encounter (HOSPITAL_COMMUNITY): Payer: Self-pay | Admitting: Orthopedic Surgery

## 2019-04-07 LAB — AEROBIC/ANAEROBIC CULTURE W GRAM STAIN (SURGICAL/DEEP WOUND): Culture: NO GROWTH

## 2019-04-11 ENCOUNTER — Emergency Department (HOSPITAL_COMMUNITY)
Admission: EM | Admit: 2019-04-11 | Discharge: 2019-04-11 | Disposition: A | Discharge status: Home / Self Care | Payer: Medicaid Other | Attending: Emergency Medicine | Admitting: Emergency Medicine

## 2019-04-11 ENCOUNTER — Emergency Department (HOSPITAL_COMMUNITY): Payer: Medicaid Other

## 2019-04-11 ENCOUNTER — Other Ambulatory Visit: Payer: Self-pay

## 2019-04-11 ENCOUNTER — Encounter (HOSPITAL_COMMUNITY): Payer: Self-pay

## 2019-04-11 DIAGNOSIS — Z96652 Presence of left artificial knee joint: Secondary | ICD-10-CM | POA: Insufficient documentation

## 2019-04-11 DIAGNOSIS — R1011 Right upper quadrant pain: Secondary | ICD-10-CM | POA: Diagnosis not present

## 2019-04-11 DIAGNOSIS — F17290 Nicotine dependence, other tobacco product, uncomplicated: Secondary | ICD-10-CM | POA: Diagnosis not present

## 2019-04-11 DIAGNOSIS — Z79899 Other long term (current) drug therapy: Secondary | ICD-10-CM | POA: Diagnosis not present

## 2019-04-11 DIAGNOSIS — I10 Essential (primary) hypertension: Secondary | ICD-10-CM | POA: Insufficient documentation

## 2019-04-11 DIAGNOSIS — Z7901 Long term (current) use of anticoagulants: Secondary | ICD-10-CM | POA: Diagnosis not present

## 2019-04-11 DIAGNOSIS — R1031 Right lower quadrant pain: Secondary | ICD-10-CM | POA: Diagnosis not present

## 2019-04-11 DIAGNOSIS — F17220 Nicotine dependence, chewing tobacco, uncomplicated: Secondary | ICD-10-CM | POA: Insufficient documentation

## 2019-04-11 LAB — COMPREHENSIVE METABOLIC PANEL
ALT: 35 U/L (ref 0–44)
AST: 26 U/L (ref 15–41)
Albumin: 3.7 g/dL (ref 3.5–5.0)
Alkaline Phosphatase: 118 U/L (ref 38–126)
Anion gap: 11 (ref 5–15)
BUN: 26 mg/dL — ABNORMAL HIGH (ref 6–20)
CO2: 22 mmol/L (ref 22–32)
Calcium: 9.2 mg/dL (ref 8.9–10.3)
Chloride: 102 mmol/L (ref 98–111)
Creatinine, Ser: 1.14 mg/dL (ref 0.61–1.24)
GFR calc Af Amer: >60 mL/min (ref >60)
GFR calc non Af Amer: >60 mL/min (ref >60)
Glucose, Bld: 102 mg/dL — ABNORMAL HIGH (ref 70–99)
Potassium: 3.6 mmol/L (ref 3.5–5.1)
Sodium: 135 mmol/L (ref 135–145)
Total Bilirubin: 1.2 mg/dL (ref 0.3–1.2)
Total Protein: 7.8 g/dL (ref 6.5–8.1)

## 2019-04-11 LAB — URINALYSIS, ROUTINE W REFLEX MICROSCOPIC
Bilirubin Urine: NEGATIVE
Glucose, UA: NEGATIVE mg/dL
Hgb urine dipstick: NEGATIVE
Ketones, ur: NEGATIVE mg/dL
Leukocytes,Ua: NEGATIVE
Nitrite: NEGATIVE
Protein, ur: NEGATIVE mg/dL
Specific Gravity, Urine: 1.025 (ref 1.005–1.030)
pH: 6 (ref 5.0–8.0)

## 2019-04-11 LAB — CBC WITH DIFFERENTIAL/PLATELET
Abs Immature Granulocytes: 0.34 10*3/uL — ABNORMAL HIGH (ref 0.00–0.07)
Basophils Absolute: 0 10*3/uL (ref 0.0–0.1)
Basophils Relative: 0 %
Eosinophils Absolute: 0.1 10*3/uL (ref 0.0–0.5)
Eosinophils Relative: 1 %
HCT: 31.1 % — ABNORMAL LOW (ref 39.0–52.0)
Hemoglobin: 10.8 g/dL — ABNORMAL LOW (ref 13.0–17.0)
Immature Granulocytes: 3 %
Lymphocytes Relative: 15 %
Lymphs Abs: 1.5 10*3/uL (ref 0.7–4.0)
MCH: 33 pg (ref 26.0–34.0)
MCHC: 34.7 g/dL (ref 30.0–36.0)
MCV: 95.1 fL (ref 80.0–100.0)
Monocytes Absolute: 0.9 10*3/uL (ref 0.1–1.0)
Monocytes Relative: 10 %
Neutro Abs: 7.1 10*3/uL (ref 1.7–7.7)
Neutrophils Relative %: 71 %
Platelets: 258 10*3/uL (ref 150–400)
RBC: 3.27 MIL/uL — ABNORMAL LOW (ref 4.22–5.81)
RDW: 13.2 % (ref 11.5–15.5)
WBC: 9.9 10*3/uL (ref 4.0–10.5)
nRBC: 0 % (ref 0.0–0.2)

## 2019-04-11 LAB — LIPASE, BLOOD: Lipase: 25 U/L (ref 11–51)

## 2019-04-11 MED ORDER — MORPHINE SULFATE (PF) 4 MG/ML IV SOLN
4.0000 mg | Freq: Once | INTRAVENOUS | Status: AC
  Administered 2019-04-11: 12:00:00 4 mg via INTRAVENOUS
  Filled 2019-04-11: qty 1

## 2019-04-11 MED ORDER — SODIUM CHLORIDE 0.9 % IV BOLUS
500.0000 mL | Freq: Once | INTRAVENOUS | Status: AC
  Administered 2019-04-11: 500 mL via INTRAVENOUS

## 2019-04-11 NOTE — ED Triage Notes (Signed)
Patient arrived via [facility] from home. Patient is AOx4 and ambulatory. Patient had knee surgery 2 weeks ago at [facility]. RLQ abdominal pain that has been intermittent and has increased in pain and severity. Patient pain is now 2 out of 10. No N/V/D. No SOB, or Chest Pain.

## 2019-04-11 NOTE — ED Notes (Signed)
Patient providing urine sample at this time at bedside.

## 2019-04-11 NOTE — ED Notes (Signed)
ED Provider at bedside. 

## 2019-04-11 NOTE — Discharge Instructions (Addendum)
You have been seen today for abdominal pain. Please read and follow all provided instructions.   1. Medications: usual home medications 2. Treatment: rest, drink plenty of fluids 3. Follow Up: Please follow up with your primary doctor in 2 days for discussion of your diagnoses and further evaluation after today's visit; if you do not have a primary care doctor use the resource guide provided to find one; Please return to the ER for any new or worsening symptoms. Please obtain all of your results from medical records or have your doctors office obtain the results - share them with your doctor - you should be seen at your doctors office. Call today to arrange your follow up.   Take medications as prescribed. Please review all of the medicines and only take them if you do not have an allergy to them. Return to the emergency room for worsening condition or new concerning symptoms. Follow up with your regular doctor. If you don't have a regular doctor use one of the numbers below to establish a primary care doctor.  Please be aware that if you are taking birth control pills, taking other prescriptions, ESPECIALLY ANTIBIOTICS may make the birth control ineffective - if this is the case, either do not engage in sexual activity or use alternative methods of birth control such as condoms until you have finished the medicine and your family doctor says it is OK to restart them. If you are on a blood thinner such as COUMADIN, be aware that any other medicine that you take may cause the coumadin to either work too much, or not enough - you should have your coumadin level rechecked in next 7 days if this is the case.  ?  It is also a possibility that you have an allergic reaction to any of the medicines that you have been prescribed - Everybody reacts differently to medications and while MOST people have no trouble with most medicines, you may have a reaction such as nausea, vomiting, rash, swelling, shortness of  breath. If this is the case, please stop taking the medicine immediately and contact your physician.  ?  You should return to the ER if you develop severe or worsening symptoms.   Emergency Department Resource Guide 1) Find a Doctor and Pay Out of Pocket Although you won't have to find out who is covered by your insurance plan, it is a good idea to ask around and get recommendations. You will then need to call the office and see if the doctor you have chosen will accept you as a new patient and what types of options they offer for patients who are self-pay. Some doctors offer discounts or will set up payment plans for their patients who do not have insurance, but you will need to ask so you aren't surprised when you get to your appointment.  2) Contact Your Local Health Department Not all health departments have doctors that can see patients for sick visits, but many do, so it is worth a call to see if yours does. If you don't know where your local health department is, you can check in your phone book. The CDC also has a tool to help you locate your state's health department, and many state websites also have listings of all of their local health departments.  3) Find a [facility] If your illness is not likely to be very severe or complicated, you may want to try a walk in clinic. These are popping up all over  the country in pharmacies, drugstores, and shopping centers. They're usually staffed by nurse practitioners or physician assistants that have been trained to treat common illnesses and complaints. They're usually fairly quick and inexpensive. However, if you have serious medical issues or chronic medical problems, these are probably not your best option. ° °No Primary Care Doctor: °Call [facility] at  832-8000 - they can help you locate a primary care doctor that  accepts your insurance, provides certain services, etc. °Physician Referral Service- 1-800-533-3463 ° °Chronic Pain  Problems: °Organization         Address  Phone   Notes  °[facility]  ([phone]) [phone] Patients need to be referred by their primary care doctor.  ° °Medication Assistance: °Organization         Address  Phone   Notes  °[place] [facility] [address] °[place], [place] [address] [phone]) [phone] --Must be a resident of [place] °-- Must have NO insurance coverage whatsoever (no Medicaid/ Medicare, etc.) °-- The pt. MUST have a primary care doctor that directs their care regularly and follows them in the community °  °[facility]  (866) 331-1348   °[employer]  (888) 892-1162   ° °Agencies that provide inexpensive medical care: °Organization         Address  Phone   Notes  °[facility]  ([phone]) [phone]   °[facility]    ([phone]) [phone]   °[facility] [address] °[place], [place] [address] ([phone]) [phone]   °[facility] [address], °[place] ([phone]) [phone]   °[facility]    (336) 373-0678   °[facility]    (336) 272-1050   °[facility] ° [address], [place] Phone:  (336) [phone], Fax:  ([phone]) [phone] Hours of Operation:  9 am - 6 pm, M-F.  Also accepts Medicaid/Medicare and self-pay.  °[facility] ° [address], [place] Phone: ([phone]) [phone], Fax: ([phone]) [phone]. Hours of Operation:  8:30 am - 5:30 pm, M-F.  Also accepts Medicaid and self-pay.  °[facility] [place] [address], [place] Phone: (336) [phone]   °[facility] [address], [place], [place] (336)[phone], Ext. 123 Mondays & Thursdays: 7-9 AM.  First 15 patients are seen on a first come, first serve basis. °  ° °Medicaid-accepting [place] Providers: ° °Organization         Address  Phone   Notes  °[facility] [address], [place] ([phone]) [phone] Also  accepts self-pay patients.  °[facility] [address], [place] ° ([phone]) [phone]   °[facility] [address], [place] ([phone]) [phone]   °[facility] [address] [place] [address], [place] ([phone]) [phone]   °[facility] [address], [place]  ° ([phone]) [phone] Only accepts [place] Access Medicaid patients after they have their name applied to their card.  ° °Self-Pay (no insurance) in [place]: ° °Organization         Address  Phone   Notes  °Sickle Cell Patients, [facility] [address], [place] ([phone]) [phone]   °[facility] [address], [place] ([phone]) [phone]   °[facility] [place] ° [address], [place] ([phone]) [phone]   °[facility]/Dr. Osei-Bonsu ° [address] [place] [address], [place] or [address], [place] [phone]) [phone] Phone number for both [place] and [place] locations is the same.  °[facility] [address], [place] ([phone]) [phone]   °[facility] [address] [place] [address],  or [address] ([phone]) [phone] °([phone]) [phone]   °[facility] [address],  ([phone]) [phone], phone; ([phone]) [phone], fax Sees patients 1st and 3rd Saturday of every month.  Must not qualify for public or private insurance (i.e. Medicaid, Medicare, [place] [employer], Veterans' Benefits)  Household income should be no more than 200% of the poverty level The clinic cannot treat you if you are pregnant or think you are pregnant  Sexually transmitted diseases are not treated at the clinic.  °  ° °
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
## 2019-08-08 ENCOUNTER — Emergency Department (HOSPITAL_COMMUNITY)
Admission: EM | Admit: 2019-08-08 | Discharge: 2019-08-09 | Disposition: A | Discharge status: Home / Self Care | Payer: Medicaid Other | Attending: Emergency Medicine | Admitting: Emergency Medicine

## 2019-08-08 DIAGNOSIS — R45851 Suicidal ideations: Secondary | ICD-10-CM | POA: Insufficient documentation

## 2019-08-08 DIAGNOSIS — Z7901 Long term (current) use of anticoagulants: Secondary | ICD-10-CM | POA: Insufficient documentation

## 2019-08-08 DIAGNOSIS — R45850 Homicidal ideations: Secondary | ICD-10-CM | POA: Insufficient documentation

## 2019-08-08 DIAGNOSIS — Z20828 Contact with and (suspected) exposure to other viral communicable diseases: Secondary | ICD-10-CM | POA: Insufficient documentation

## 2019-08-08 DIAGNOSIS — F1414 Cocaine abuse with cocaine-induced mood disorder: Secondary | ICD-10-CM | POA: Diagnosis not present

## 2019-08-08 DIAGNOSIS — F1020 Alcohol dependence, uncomplicated: Secondary | ICD-10-CM

## 2019-08-08 DIAGNOSIS — F319 Bipolar disorder, unspecified: Secondary | ICD-10-CM

## 2019-08-08 DIAGNOSIS — Z79899 Other long term (current) drug therapy: Secondary | ICD-10-CM | POA: Diagnosis not present

## 2019-08-08 DIAGNOSIS — F4310 Post-traumatic stress disorder, unspecified: Secondary | ICD-10-CM | POA: Insufficient documentation

## 2019-08-08 DIAGNOSIS — F1910 Other psychoactive substance abuse, uncomplicated: Secondary | ICD-10-CM

## 2019-08-08 DIAGNOSIS — F1994 Other psychoactive substance use, unspecified with psychoactive substance-induced mood disorder: Secondary | ICD-10-CM

## 2019-08-08 DIAGNOSIS — I10 Essential (primary) hypertension: Secondary | ICD-10-CM | POA: Diagnosis not present

## 2019-08-08 DIAGNOSIS — F315 Bipolar disorder, current episode depressed, severe, with psychotic features: Secondary | ICD-10-CM | POA: Insufficient documentation

## 2019-08-08 DIAGNOSIS — F17210 Nicotine dependence, cigarettes, uncomplicated: Secondary | ICD-10-CM | POA: Diagnosis not present

## 2019-08-08 DIAGNOSIS — F329 Major depressive disorder, single episode, unspecified: Secondary | ICD-10-CM | POA: Diagnosis present

## 2019-08-08 LAB — URINALYSIS, ROUTINE W REFLEX MICROSCOPIC
Bilirubin Urine: NEGATIVE
Glucose, UA: NEGATIVE mg/dL
Hgb urine dipstick: NEGATIVE
Ketones, ur: 5 mg/dL — AB
Leukocytes,Ua: NEGATIVE
Nitrite: NEGATIVE
Protein, ur: NEGATIVE mg/dL
Specific Gravity, Urine: 1.025 (ref 1.005–1.030)
pH: 5 (ref 5.0–8.0)

## 2019-08-08 LAB — BASIC METABOLIC PANEL WITH GFR
Anion gap: 13 (ref 5–15)
BUN: 18 mg/dL (ref 6–20)
CO2: 18 mmol/L — ABNORMAL LOW (ref 22–32)
Calcium: 9.6 mg/dL (ref 8.9–10.3)
Chloride: 111 mmol/L (ref 98–111)
Creatinine, Ser: 1.08 mg/dL (ref 0.61–1.24)
GFR calc Af Amer: >60 mL/min
GFR calc non Af Amer: >60 mL/min
Glucose, Bld: 78 mg/dL (ref 70–99)
Potassium: 3.6 mmol/L (ref 3.5–5.1)
Sodium: 142 mmol/L (ref 135–145)

## 2019-08-08 LAB — CBC WITH DIFFERENTIAL/PLATELET
Abs Immature Granulocytes: 0.04 K/uL (ref 0.00–0.07)
Basophils Absolute: 0 K/uL (ref 0.0–0.1)
Basophils Relative: 0 %
Eosinophils Absolute: 0 K/uL (ref 0.0–0.5)
Eosinophils Relative: 0 %
HCT: 43.5 % (ref 39.0–52.0)
Hemoglobin: 14.7 g/dL (ref 13.0–17.0)
Immature Granulocytes: 1 %
Lymphocytes Relative: 18 %
Lymphs Abs: 1.4 K/uL (ref 0.7–4.0)
MCH: 31.4 pg (ref 26.0–34.0)
MCHC: 33.8 g/dL (ref 30.0–36.0)
MCV: 92.9 fL (ref 80.0–100.0)
Monocytes Absolute: 0.5 K/uL (ref 0.1–1.0)
Monocytes Relative: 7 %
Neutro Abs: 5.8 K/uL (ref 1.7–7.7)
Neutrophils Relative %: 74 %
Platelets: 142 K/uL — ABNORMAL LOW (ref 150–400)
RBC: 4.68 MIL/uL (ref 4.22–5.81)
RDW: 14.6 % (ref 11.5–15.5)
WBC: 7.8 K/uL (ref 4.0–10.5)
nRBC: 0 % (ref 0.0–0.2)

## 2019-08-08 LAB — RAPID URINE DRUG SCREEN, HOSP PERFORMED
Amphetamines: NOT DETECTED
Barbiturates: NOT DETECTED
Benzodiazepines: NOT DETECTED
Cocaine: POSITIVE — AB
Opiates: NOT DETECTED
Tetrahydrocannabinol: POSITIVE — AB

## 2019-08-08 LAB — SALICYLATE LEVEL: Salicylate Lvl: <7 mg/dL (ref 2.8–30.0)

## 2019-08-08 LAB — ACETAMINOPHEN LEVEL: Acetaminophen (Tylenol), Serum: <10 ug/mL — ABNORMAL LOW (ref 10–30)

## 2019-08-08 LAB — ETHANOL: Alcohol, Ethyl (B): 43 mg/dL — ABNORMAL HIGH

## 2019-08-08 MED ORDER — LORAZEPAM 2 MG/ML IJ SOLN: 0.0000 mg | Freq: Two times a day (BID) | INTRAMUSCULAR | Status: DC

## 2019-08-08 MED ORDER — LORAZEPAM 2 MG/ML IJ SOLN: 0.0000 mg | Freq: Four times a day (QID) | INTRAMUSCULAR | Status: DC

## 2019-08-08 MED ORDER — ACETAMINOPHEN 325 MG PO TABS
650.0000 mg | ORAL_TABLET | Freq: Once | ORAL | Status: AC
  Administered 2019-08-09: 01:00:00 650 mg via ORAL
  Filled 2019-08-08: qty 2

## 2019-08-08 MED ORDER — VITAMIN B-1 100 MG PO TABS
100.0000 mg | ORAL_TABLET | Freq: Every day | ORAL | Status: DC
  Administered 2019-08-08 – 2019-08-09 (×2): 100 mg via ORAL
  Filled 2019-08-08 (×2): qty 1

## 2019-08-08 MED ORDER — LORAZEPAM 1 MG PO TABS: 0.0000 mg | ORAL_TABLET | Freq: Two times a day (BID) | ORAL | Status: DC

## 2019-08-08 MED ORDER — THIAMINE HCL 100 MG/ML IJ SOLN: 100.0000 mg | Freq: Every day | INTRAMUSCULAR | Status: DC

## 2019-08-08 MED ORDER — LORAZEPAM 1 MG PO TABS
0.0000 mg | ORAL_TABLET | Freq: Four times a day (QID) | ORAL | Status: DC
  Administered 2019-08-08: 2 mg via ORAL
  Administered 2019-08-09: 11:00:00 1 mg via ORAL
  Administered 2019-08-09: 01:00:00 2 mg via ORAL
  Filled 2019-08-08 (×2): qty 2
  Filled 2019-08-08: qty 1

## 2019-08-08 NOTE — ED Notes (Signed)
Patient has been transferred from [facility] to ED [facility] however no belongings have been given or noted in patient chart from previous RN.

## 2019-08-08 NOTE — ED Notes (Signed)
Patient has been given two sandwiches and two drinks to consume.
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
## 2019-08-08 NOTE — BH Assessment (Signed)
TTS assessment cannot be completed at this time because Pt is in a hallway.   Donald Jacobson, [facility], [facility] Triage Specialist [phone]
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
## 2019-08-08 NOTE — ED Provider Notes (Signed)
Update from a behavioral health, Marijean Bravo, recommends in patient treatment. Patient would like to go to the [facility] in [place]. COVID test ordered while patient awaits placement.    Tacy Learn, PA-C 08/09/19 0154    Valarie Merino, MD 08/09/19 1150

## 2019-08-09 DIAGNOSIS — F1414 Cocaine abuse with cocaine-induced mood disorder: Secondary | ICD-10-CM

## 2019-08-09 DIAGNOSIS — F315 Bipolar disorder, current episode depressed, severe, with psychotic features: Secondary | ICD-10-CM

## 2019-08-09 DIAGNOSIS — F319 Bipolar disorder, unspecified: Secondary | ICD-10-CM

## 2019-08-09 DIAGNOSIS — F4310 Post-traumatic stress disorder, unspecified: Secondary | ICD-10-CM

## 2019-08-09 DIAGNOSIS — F1020 Alcohol dependence, uncomplicated: Secondary | ICD-10-CM

## 2019-08-09 LAB — SARS CORONAVIRUS 2 (TAT 6-24 HRS): SARS Coronavirus 2: NEGATIVE

## 2019-08-09 MED ORDER — GABAPENTIN 300 MG PO CAPS: 300.0000 mg | ORAL_CAPSULE | Freq: Two times a day (BID) | ORAL | Status: DC

## 2019-08-09 NOTE — Discharge Instructions (Addendum)
It was our pleasure to provide your ER care today - we hope that you feel better.  Avoid any drug use - use resource guide provided for community treatment options.   Also follow up with your doctors/psychiatry at the [facility] in the next 1-2 days - call tomorrow AM to arrange that follow up.   If mental health issues and/or crisis, you may go directly to [facility].  Return to ER if worse, new symptoms, severe depression, thoughts of harming self or others, or other concern.

## 2019-08-09 NOTE — ED Notes (Signed)
Patient is now asking to speak with doctor and has stated, "I feel better, and Im ready to check out, I promise Im not going to kill myself or anybody".

## 2019-08-09 NOTE — ED Notes (Signed)
Psych Providers are on TelePsych machine conducting assessment in [place].
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
## 2019-08-09 NOTE — ED Notes (Signed)
RN has allowed patient to use Hospital Mobile phone to call family or friend while waiting.

## 2019-08-09 NOTE — ED Provider Notes (Signed)
RN indicates patient no longer having thoughts of harm to self or others, has been cooperative, and that he requests to speak with MD.   Patient indicates when abusing cocaine/drugs/etoh, he can feel depressed and 'say things', but that currently he is feeling much better, and requests d/c to home.    Patient is alert, oriented, cooperative and conversant. He exhibits normal mood and affect. Patient denies any thoughts and/or plan of harm to self or anyone else. He re-states that he feels earlier symptoms were precipitated by substance abuse, and that he feels fine, and not a threat to self or anyone else.   Patient has no hallucinations or delusions. Thought processes appear clear and rational. Vitals normal. Patient is eating/drinking, has normal appetite.   Patient states he plans to follow up with his doctors/psychiatry at the [facility] as outpatient.   Patient currently appears stable for d/c. Return precautions discussed and provided.      Lajean Saver, MD 08/09/19 1723

## 2019-08-09 NOTE — Consult Note (Addendum)
Patient seen via telepsych by this provider and Dr. Darleene Jacobson; and his chart was reviewed on 08/09/2019.  Donald Jacobson collaborates information obtained in [facility] assessment.  He reports a history for depression, PTSD and bipolar.  He is not medication compliant,  cannot contract for safety and endorses active polysubstance usage.  Recommend 24 hour observation at which time he can be restarted on medications and side effects monitored prior to discharge to home where he can follow-up with the [place].    Patient seen face-to-face for psychiatric evaluation, chart reviewed and case discussed with the physician extender and developed treatment plan. Reviewed the information documented and agree with the treatment plan. Donald Pilgrim, MD

## 2019-08-09 NOTE — ED Notes (Signed)
When asking patient this morning if patient currently is still having any thoughts of SI or HI, patient stated "I don't need to be here now".

## 2019-12-06 IMAGING — CT CT ABDOMEN AND PELVIS WITHOUT CONTRAST
2 of 4 series · 16 of 46 positions shown, 18 images · non-contrast
Comparison: None.

CLINICAL DATA: Abdominal pain, appendicitis suspected

EXAM:
CT ABDOMEN AND PELVIS WITHOUT CONTRAST
TECHNIQUE: Multidetector CT imaging of the abdomen and pelvis was performed
following the standard protocol without IV contrast. Oral enteric
contrast was administered.

[Series 2: axial st · axial · 0.71mm/px · z∈[-440,-50]mm · 13 of 91 slices shown, 15 images]
[im 7/91  soft-tissue]
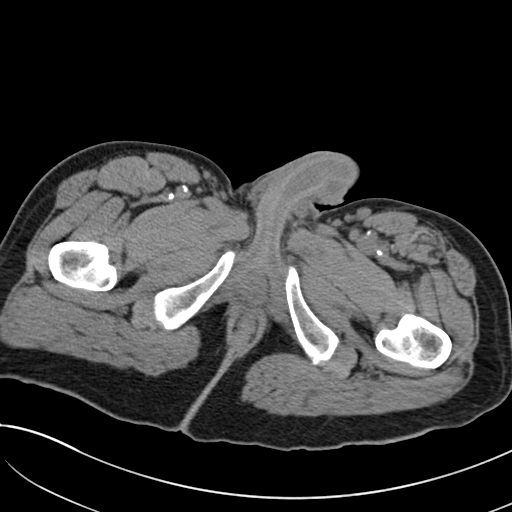
[im 7/91  bone]
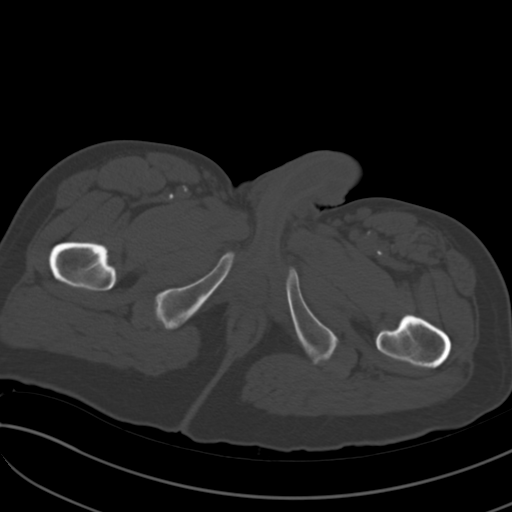
[im 13/91  soft-tissue]
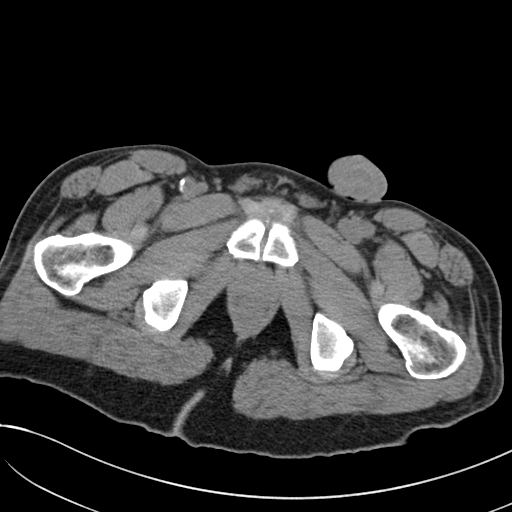
[im 19/91  soft-tissue]
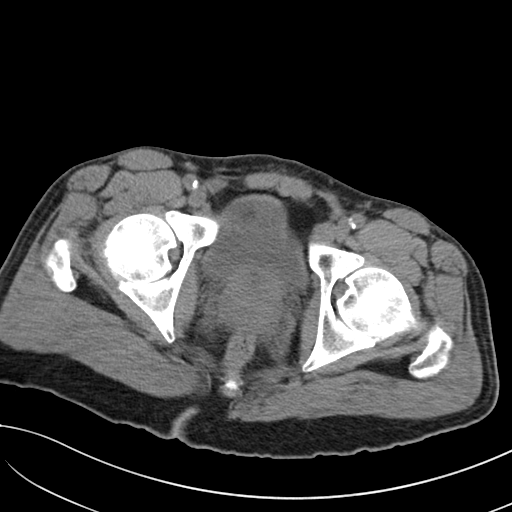
[im 25/91  soft-tissue]
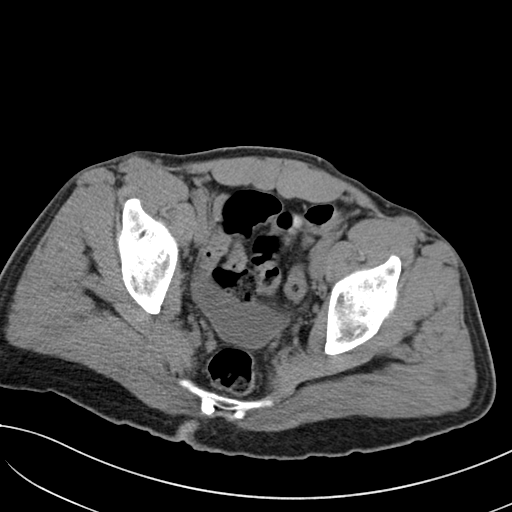
[im 31/91  soft-tissue]
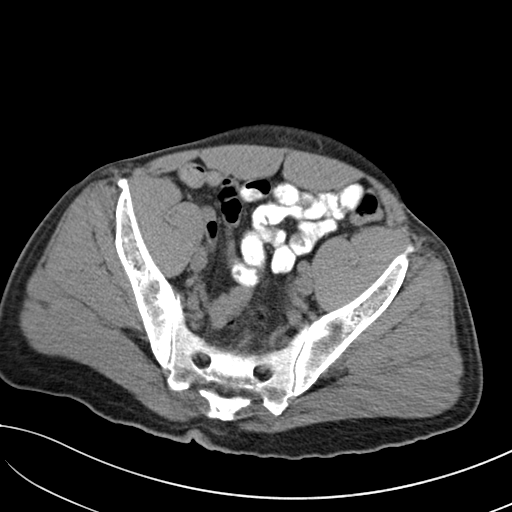
[im 37/91  soft-tissue]
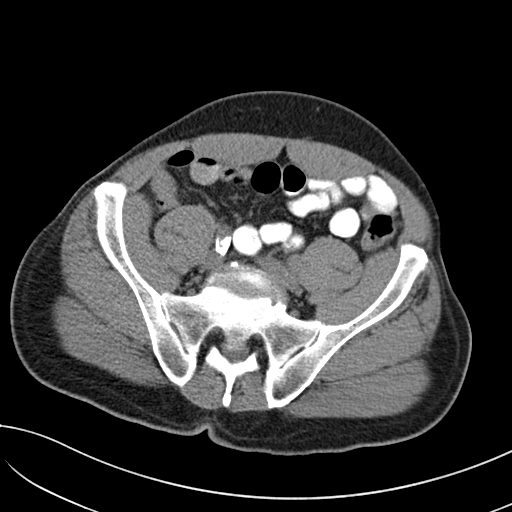
[im 49/91  soft-tissue]
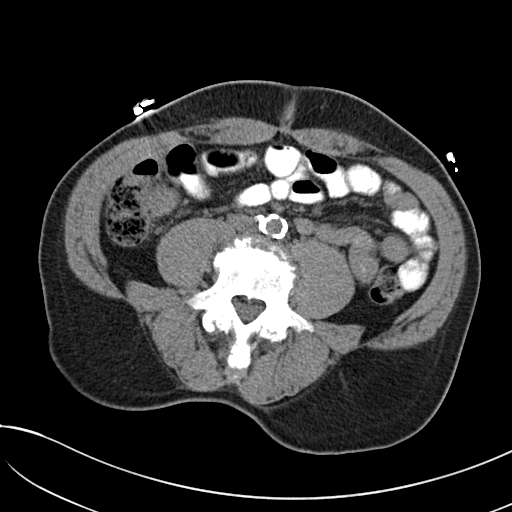
[im 55/91  soft-tissue]
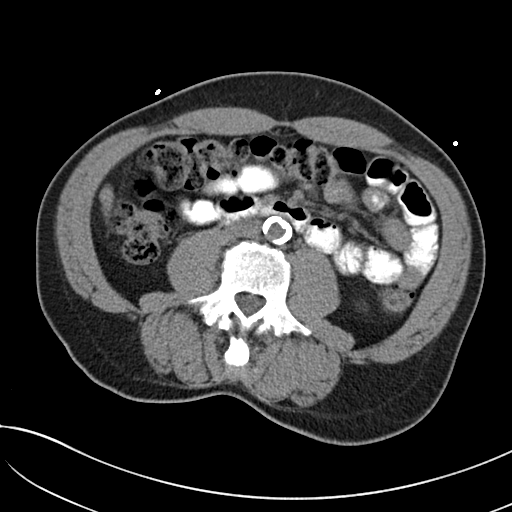
[im 61/91  soft-tissue]
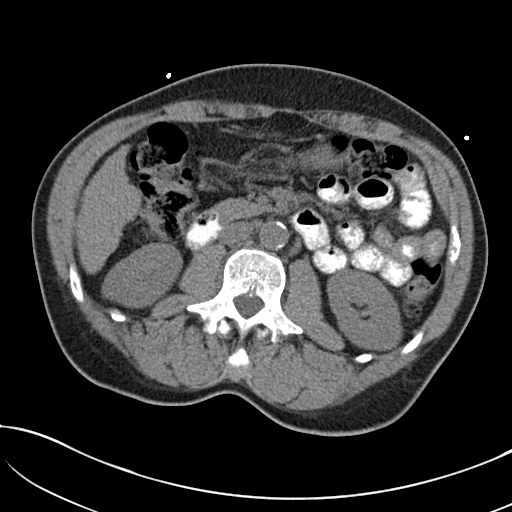
[im 61/91  bone]
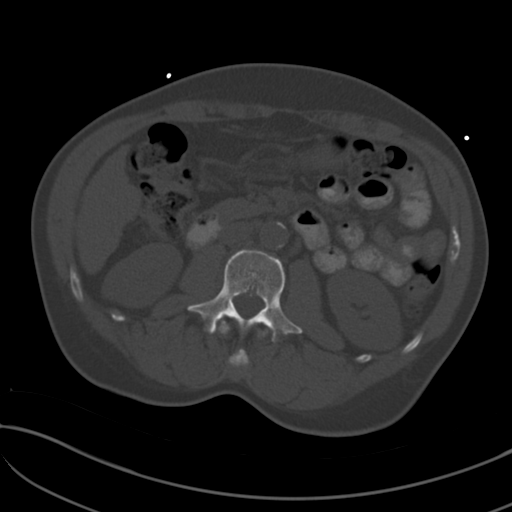
[im 67/91  soft-tissue]
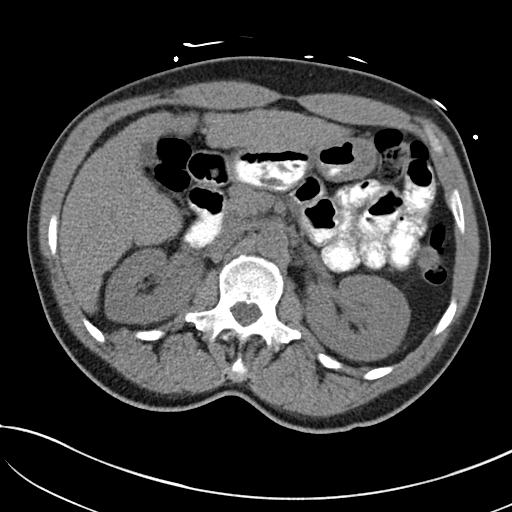
[im 73/91  soft-tissue]
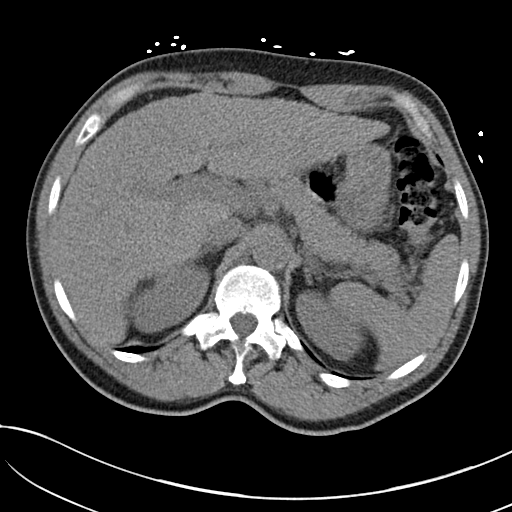
[im 79/91  soft-tissue]
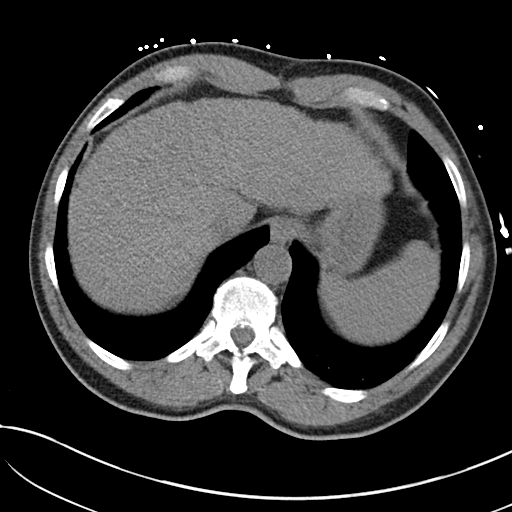
[im 85/91  soft-tissue]
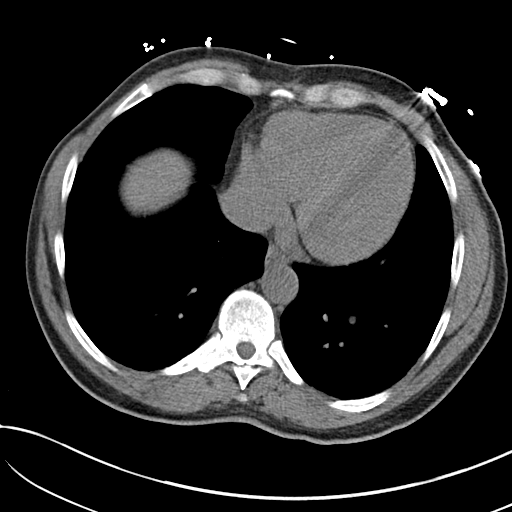

[Series 5: coronal st · coronal · 0.76mm/px · 3 of 151 slices shown]
[im 51/151  soft-tissue]
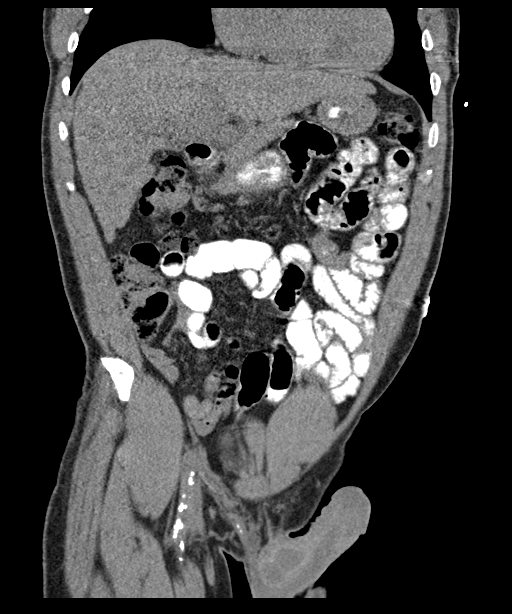
[im 67/151  soft-tissue]
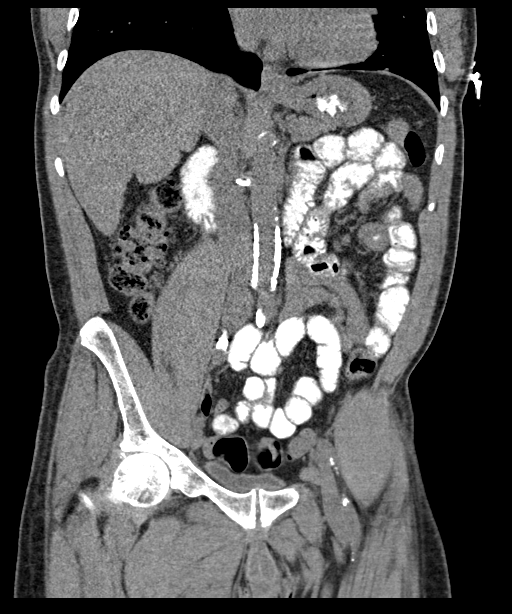
[im 84/151  soft-tissue]
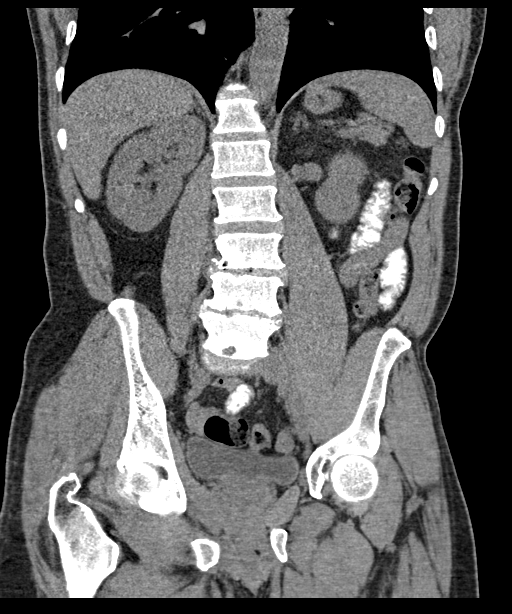

[16 of 46 positions shown; findings below may reference images not displayed]

FINDINGS: Lower chest: No acute abnormality. Mild bibasilar scarring or
atelectasis.

Hepatobiliary: No solid liver abnormality is seen. Coarse, nodular
contour of the liver. No gallstones, gallbladder wall thickening, or
biliary dilatation.

Pancreas: Unremarkable. No pancreatic ductal dilatation or
surrounding inflammatory changes.

Spleen: Normal in size without significant abnormality.

Adrenals/Urinary Tract: Adrenal glands are unremarkable. Kidneys are
normal, without renal calculi, solid lesion, or hydronephrosis.
Bladder is unremarkable.

Stomach/Bowel: Stomach is within normal limits. Appendix appears
normal. No evidence of bowel wall thickening, distention, or
inflammatory changes.

Vascular/Lymphatic: Aortic atherosclerosis. No enlarged abdominal or
pelvic lymph nodes.

Reproductive: No mass or other significant abnormality.

Other: No abdominal wall hernia or abnormality. No abdominopelvic
ascites.

Musculoskeletal: No acute or significant osseous findings.
IMPRESSION: 1. No acute noncontrast CT findings of the abdomen or pelvis to
explain abdominal pain. Normal appendix.

2. Coarse, nodular contour of the liver, suggestive of cirrhosis.
Correlate with biochemical findings.

## 2022-04-29 ENCOUNTER — Inpatient Hospital Stay
Admit: 2022-04-29 | Discharge: 2022-05-11 | Disposition: A | Discharge status: Other Acute Inpatient Hospital | Admitting: Addiction Medicine

## 2022-04-29 ENCOUNTER — Emergency Department: Admit: 2022-04-29

## 2022-04-29 DIAGNOSIS — U071 COVID-19: Principal | ICD-10-CM

## 2022-04-29 DIAGNOSIS — M25562 Pain in left knee: Secondary | ICD-10-CM

## 2022-04-29 LAB — URINALYSIS WITH REFLEX TO CULTURE
BACTERIA, URINE: NEGATIVE /hpf
Bilirubin Urine: NEGATIVE
Blood, Urine: NEGATIVE
Glucose, UA: NEGATIVE mg/dL
Ketones, Urine: 5 mg/dL — AB
Leukocyte Esterase, Urine: NEGATIVE
Nitrite, Urine: NEGATIVE
Protein, UA: NEGATIVE mg/dL
Specific Gravity, UA: 1.019 (ref 1.003–1.030)
Urobilinogen, Urine: 4 EU/dL — ABNORMAL HIGH (ref 0.1–1.0)
pH, Urine: 6 (ref 5.0–8.0)

## 2022-04-29 LAB — URINE DRUG SCREEN
Amphetamine, Urine: NEGATIVE
Barbiturates, Urine: NEGATIVE
Benzodiazepines, Urine: NEGATIVE
Cocaine, Urine: POSITIVE — AB
Methadone, Urine: NEGATIVE
Opiates, Urine: NEGATIVE
PCP, Urine: NEGATIVE
THC, TH-Cannabinol, Urine: POSITIVE — AB

## 2022-04-29 LAB — CBC WITH AUTO DIFFERENTIAL
Absolute Immature Granulocyte: 0.1 10*3/uL — ABNORMAL HIGH (ref 0.00–0.04)
Basophils %: 1 % (ref 0–1)
Basophils Absolute: 0 10*3/uL (ref 0.0–0.1)
Eosinophils %: 1 % (ref 0–7)
Eosinophils Absolute: 0.1 10*3/uL (ref 0.0–0.4)
Hematocrit: 40.8 % (ref 36.6–50.3)
Hemoglobin: 14 g/dL (ref 12.1–17.0)
Immature Granulocytes: 1 % — ABNORMAL HIGH (ref 0–0.5)
Lymphocytes %: 41 % (ref 12–49)
Lymphocytes Absolute: 1.7 10*3/uL (ref 0.8–3.5)
MCH: 31 PG (ref 26.0–34.0)
MCHC: 34.3 g/dL (ref 30.0–36.5)
MCV: 90.3 FL (ref 80.0–99.0)
MPV: 9.9 FL (ref 8.9–12.9)
Monocytes %: 9 % (ref 5–13)
Monocytes Absolute: 0.4 10*3/uL (ref 0.0–1.0)
Neutrophils %: 47 % (ref 32–75)
Neutrophils Absolute: 2 10*3/uL (ref 1.8–8.0)
Nucleated RBCs: 0 PER 100 WBC
Platelets: 146 10*3/uL — ABNORMAL LOW (ref 150–400)
RBC: 4.52 M/uL (ref 4.10–5.70)
RDW: 13.2 % (ref 11.5–14.5)
WBC: 4.3 10*3/uL (ref 4.1–11.1)
nRBC: 0 10*3/uL (ref 0.00–0.01)

## 2022-04-29 LAB — COMPREHENSIVE METABOLIC PANEL
ALT: 30 U/L (ref 12–78)
AST: 20 U/L (ref 15–37)
Albumin/Globulin Ratio: 1 — ABNORMAL LOW (ref 1.1–2.2)
Albumin: 3.5 g/dL (ref 3.5–5.0)
Alk Phosphatase: 123 U/L — ABNORMAL HIGH (ref 45–117)
Anion Gap: 5 mmol/L (ref 5–15)
BUN: 10 mg/dL (ref 6–20)
Bun/Cre Ratio: 11 — ABNORMAL LOW (ref 12–20)
CO2: 29 mmol/L (ref 21–32)
Calcium: 8.9 mg/dL (ref 8.5–10.1)
Chloride: 109 mmol/L — ABNORMAL HIGH (ref 97–108)
Creatinine: 0.89 mg/dL (ref 0.70–1.30)
Est, Glom Filt Rate: >60 mL/min/{1.73_m2} (ref >60)
Globulin: 3.6 g/dL (ref 2.0–4.0)
Glucose: 102 mg/dL — ABNORMAL HIGH (ref 65–100)
Potassium: 4.1 mmol/L (ref 3.5–5.1)
Sodium: 143 mmol/L (ref 136–145)
Total Bilirubin: 0.4 mg/dL (ref 0.2–1.0)
Total Protein: 7.1 g/dL (ref 6.4–8.2)

## 2022-04-29 LAB — COVID-19 & INFLUENZA COMBO
Rapid Influenza A By PCR: NEGATIVE — AB
Rapid Influenza B By PCR: NEGATIVE — AB
SARS-CoV-2, PCR: DETECTED — AB

## 2022-04-29 LAB — ETHANOL: Ethanol Lvl: <10 mg/dL (ref <10)

## 2022-04-29 MED ORDER — ACETAMINOPHEN 325 MG PO TABS
325 MG | ORAL | Status: AC
Start: 2022-04-29 — End: 2022-04-29
  Administered 2022-04-29: 15:00:00 650 mg via ORAL

## 2022-04-29 NOTE — ED Notes (Signed)
Report given to [facility], RN     Dianna Rossetti, RN  04/29/22 1924

## 2022-04-29 NOTE — ED Provider Notes (Signed)
[facility] EMERGENCY DEPT  EMERGENCY DEPARTMENT HISTORY AND PHYSICAL EXAM      Date: 04/29/2022  Patient Name: Andrew Hester  MRN: [id number]  [place]: 1960/10/30  Date of evaluation: 04/29/2022  Provider: Lelon Mast, APRN - NP   Note Started: 5:00 PM EDT 04/29/22    HISTORY OF PRESENT ILLNESS     Chief Complaint   Patient presents with    Knee Pain       History Provided By: Patient    HPI: Andrew Hester is a 61 y.o. male past medical history of hypertension presents to the ER with knee pain.  Patient states he was traveling with friends when they left him in [place] yesterday.  Patient has been without his medicines or food.  Patient complains of left knee pain.    PAST MEDICAL HISTORY   Past Medical History:  Past Medical History:   Diagnosis Date    Hypertension        Past Surgical History:  Past Surgical History:   Procedure Laterality Date    JOINT REPLACEMENT Left        Family History:  History reviewed. No pertinent family history.    Social History:  Social History     Tobacco Use    Smoking status: Every Day     Types: Cigarettes    Smokeless tobacco: Never   Substance Use Topics    Drug use: Yes     Types: Marijuana Sherrie Mustache)       Allergies:  Allergies   Allergen Reactions    Contrast [Iodides] Nausea And Vomiting       PCP: None None    Current Meds:   No current facility-administered medications for this encounter.     No current outpatient medications on file.       Social Determinants of Health:   Social Determinants of Health     Tobacco Use: High Risk    Smoking Tobacco Use: Every Day    Smokeless Tobacco Use: Never    Passive Exposure: Not on file   Alcohol Use: Not At Risk    Frequency of Alcohol Consumption: Never    Average Number of Drinks: Patient does not drink    Frequency of Binge Drinking: Never   Emergency planning/management officer Strain: Not on file   Food Insecurity: Not on file   Transportation Needs: Not on file   Physical Activity: Not on file   Stress: Not on file   Social Connections: Not on file    Intimate Partner Violence: Not on file   Depression: Not on file   Housing Stability: Not on file       PHYSICAL EXAM   Physical Exam  Constitutional:       General: He is not in acute distress.     Appearance: Normal appearance. He is normal weight. He is not ill-appearing or toxic-appearing.   HENT:      Head: Normocephalic and atraumatic.      Nose: Nose normal.      Mouth/Throat:      Mouth: Mucous membranes are moist.      Pharynx: Oropharynx is clear.   Eyes:      Extraocular Movements: Extraocular movements intact.      Conjunctiva/sclera: Conjunctivae normal.      Pupils: Pupils are equal, round, and reactive to light.   Cardiovascular:      Rate and Rhythm: Normal rate.   Pulmonary:      Effort: Pulmonary effort is normal.  Breath sounds: Normal breath sounds.   Abdominal:      General: Abdomen is flat. Bowel sounds are normal.   Musculoskeletal:         General: Normal range of motion.      Cervical back: Normal range of motion and neck supple.   Skin:     General: Skin is warm and dry.   Neurological:      General: No focal deficit present.      Mental Status: He is alert and oriented to person, place, and time. Mental status is at baseline.   Psychiatric:         Attention and Perception: Attention normal.         Mood and Affect: Mood normal.         Thought Content: Thought content includes homicidal and suicidal ideation.         SCREENINGS              LAB, EKG AND DIAGNOSTIC RESULTS   Labs:  Recent Results (from the past 12 hour(s))   Ethanol    Collection Time: 04/29/22  3:39 PM   Result Value Ref Range    Ethanol Lvl <10 <10 mg/dL   Comprehensive Metabolic Panel    Collection Time: 04/29/22  3:39 PM   Result Value Ref Range    Sodium 143 136 - 145 mmol/L    Potassium 4.1 3.5 - 5.1 mmol/L    Chloride 109 (H) 97 - 108 mmol/L    CO2 29 21 - 32 mmol/L    Anion Gap 5 5 - 15 mmol/L    Glucose 102 (H) 65 - 100 mg/dL    BUN 10 6 - 20 mg/dL    Creatinine 0.89 0.70 - 1.30 mg/dL    Bun/Cre Ratio 11 (L)  12 - 20      Est, Glom Filt Rate >60 >60 ml/min/1.31m    Calcium 8.9 8.5 - 10.1 mg/dL    Total Bilirubin 0.4 0.2 - 1.0 mg/dL    AST 20 15 - 37 U/L    ALT 30 12 - 78 U/L    Alk Phosphatase 123 (H) 45 - 117 U/L    Total Protein 7.1 6.4 - 8.2 g/dL    Albumin 3.5 3.5 - 5.0 g/dL    Globulin 3.6 2.0 - 4.0 g/dL    Albumin/Globulin Ratio 1.0 (L) 1.1 - 2.2     CBC with Auto Differential    Collection Time: 04/29/22  3:39 PM   Result Value Ref Range    WBC 4.3 4.1 - 11.1 K/uL    RBC 4.52 4.10 - 5.70 M/uL    Hemoglobin 14.0 12.1 - 17.0 g/dL    Hematocrit 40.8 36.6 - 50.3 %    MCV 90.3 80.0 - 99.0 FL    MCH 31.0 26.0 - 34.0 PG    MCHC 34.3 30.0 - 36.5 g/dL    RDW 13.2 11.5 - 14.5 %    Platelets 146 (L) 150 - 400 K/uL    MPV 9.9 8.9 - 12.9 FL    Nucleated RBCs 0.0 0.0 PER 100 WBC    nRBC 0.00 0.00 - 0.01 K/uL    Neutrophils % 47 32 - 75 %    Lymphocytes % 41 12 - 49 %    Monocytes % 9 5 - 13 %    Eosinophils % 1 0 - 7 %    Basophils % 1 0 - 1 %    Immature  Granulocytes 1 (H) 0 - 0.5 %    Neutrophils Absolute 2.0 1.8 - 8.0 K/UL    Lymphocytes Absolute 1.7 0.8 - 3.5 K/UL    Monocytes Absolute 0.4 0.0 - 1.0 K/UL    Eosinophils Absolute 0.1 0.0 - 0.4 K/UL    Basophils Absolute 0.0 0.0 - 0.1 K/UL    Absolute Immature Granulocyte 0.1 (H) 0.00 - 0.04 K/UL    Differential Type AUTOMATED     COVID-19 & Influenza Combo    Collection Time: 04/29/22  3:39 PM    Specimen: Nasopharyngeal   Result Value Ref Range    SARS-CoV-2, PCR DETECTED (A) Not Detected      Rapid Influenza A By PCR Negative (A) Not Detected      Rapid Influenza B By PCR Negative (A) Not Detected     Urine Drug Screen    Collection Time: 04/29/22  3:46 PM   Result Value Ref Range    Amphetamine, Urine Negative Negative      Barbiturates, Urine Negative Negative      Benzodiazepines, Urine Negative Negative      Cocaine, Urine Positive (A) Negative      Methadone, Urine Negative Negative      Opiates, Urine Negative Negative      PCP, Urine Negative Negative      THC,  TH-Cannabinol, Urine Positive (A) Negative      Comments:        This test is a screen for drugs of abuse in a medical setting only (i.e., they are unconfirmed results and as such must not be used for non-medical purposes, e.g.,employment testing, legal testing). Due to its inherent nature, false positive (FP) and false negative (FN) results may be obtained. Therefore, if necessary for medical care, recommend confirmation of positive findings by GC/MS.     Urinalysis with Reflex to Culture    Collection Time: 04/29/22  3:46 PM    Specimen: Urine   Result Value Ref Range    Color, UA Yellow/Straw      Appearance Clear Clear      Specific Gravity, UA 1.019 1.003 - 1.030      pH, Urine 6.0 5.0 - 8.0      Protein, UA Negative Negative mg/dL    Glucose, UA Negative Negative mg/dL    Ketones, Urine 5 (A) Negative mg/dL    Bilirubin Urine Negative Negative      Blood, Urine Negative Negative      Urobilinogen, Urine 4.0 (H) 0.1 - 1.0 EU/dL    Nitrite, Urine Negative Negative      Leukocyte Esterase, Urine Negative Negative      BACTERIA, URINE Negative Negative /hpf    Urine Culture if Indicated Culture not indicated by UA result Culture not indicated by UA result      WBC, UA 0-4 0 - 4 /hpf    RBC, UA 0-5 0 - 5 /hpf    Epithelial Cells UA Few Few /lpf    Mucus, UA Trace /lpf       EKG: Not Applicable    Radiologic Studies:  Non-plain film images such as CT, Ultrasound and MRI are read by the radiologist. Plain radiographic images are visualized and preliminarily interpreted by the ED Physician with the following findings: Not Applicable.    Interpretation per the Radiologist below, if available at the time of this note:  XR KNEE LEFT (3 VIEWS)   Final Result   No acute abnormality.  ED COURSE and DIFFERENTIAL DIAGNOSIS/MDM   CC/HPI Summary, DDx, ED Course, and Reassessment: Patient presents with knee injury.  Differential diagnosis include fracture, sprain, contusion.  Xray neg for fx/dislcoation.  No evidence of  vascular compromise. Appropriate for outpatient therapy. Will provide  ortho referral for further evaluation. RICE therapies discussed.     Patient presents with acute suicidal ideation. DDx:  2/2 MDD, schizoaffective d/o, bipolar, drug induced, organic cause such as electrolyte anomoly or infection. Stable vitals and benign exam. No obvious organic causes to explain behavior but will obtain psych labs, UA, UDS and speak with mental health professional about possible admission. Pt is currently voluntary. Sitter at bedside. Will continue to monitor while in ED     Records Reviewed (source and summary of external notes): Prior medical records and Nursing notes    Vitals:    Vitals:    04/29/22 0932 04/29/22 0935 04/29/22 0937 04/29/22 0947   BP:  (!) 143/93  (!) 144/85   Pulse: 52      Resp: 18      Temp: 98 F (36.7 C)      TempSrc: Oral      SpO2: 100%  94%    Weight: 74.8 kg (165 lb)      Height: 1.803 m (5' 11" )           ED COURSE       Disposition Considerations (Tests not done, Shared Decision Making, Pt Expectation of Test or Treatment.): Not Applicable    Patient was given the following medications:  Medications   acetaminophen (TYLENOL) tablet 650 mg (650 mg Oral Given 04/29/22 1121)       CONSULTS: (Who and What was discussed)  IP CONSULT TO BSMART     Social Determinants affecting Dx or Tx: None    Smoking Cessation: Not Applicable    PROCEDURES   Unless otherwise noted above, none.  Procedures      CRITICAL CARE TIME   Patient does not meet Critical Care Time, 0 minutes    FINAL IMPRESSION     1. Left knee pain, unspecified chronicity    2. COVID-19    3. Cocaine abuse ([place])    4. Suicidal ideation    5. Homicidal ideation          DISPOSITION/PLAN   [place] Hold 04/29/2022 05:02:33 PM     Behavioral health hold     PATIENT REFERRED TO:  No follow-up provider specified.      DISCHARGE MEDICATIONS:     Medication List      You have not been prescribed any medications.            DISCONTINUED MEDICATIONS:  There are no discharge medications for this patient.      I am the Primary Clinician of Record: Lelon Mast, APRN - NP (electronically signed)    (Please note that parts of this dictation were completed with voice recognition software. Quite often unanticipated grammatical, syntax, homophones, and other interpretive errors are inadvertently transcribed by the computer software. Please disregards these errors. Please excuse any errors that have escaped final proofreading.)     Lelon Mast, APRN - NP  04/29/22 1705

## 2022-04-29 NOTE — ED Notes (Signed)
Pt rounded on. Pt denies SI but endorses HI toward "peaches". States that he doesn't have a specific plan because he does not know how to find her. Pt given lunch box and continues resting comfortably in bed with unlabored breathing, and equal chest rise and fall.     Burnice Logan, RN  04/29/22 2228

## 2022-04-29 NOTE — ED Notes (Signed)
Pt resting on er stretcher in green gown pt belongings in station 3 nourishment room.   Pt watching tv, provided pt with gingerale and crackers at this time.      Dianna Rossetti, RN  04/29/22 [id number]

## 2022-04-29 NOTE — Other (Signed)
[facility] called this Clinical research associate and stated that pt has just made a call to them stating he was suicidal and homicidal and that it was NOT safe for him to be discharged.  With Ranita on speaker phone, this writer went back into pts room where he states he never said he wasn't not suicidal.  Pt argumentative and states that he needs a ROOM not a BED, which he states is what this Clinical research associate offered him.  This Clinical research associate explained to pt what our services were and pt states he now DOES need to remain inpt for voluntary treatment as he is not safe to be discharged and has no where to go.  Pam with [facility] aware.  Wilkie Aye RN aware

## 2022-04-29 NOTE — Other (Signed)
Larene with [facility] aware of pts positive COVID result.  She will present pt to Dr Donalynn Furlong and notify this Clinical research associate of his decision regarding admission

## 2022-04-29 NOTE — Care Coordination-Inpatient (Signed)
Pt Nurse asked CM to see Pt, she stated that a friend had left Pt at a gas station in [place] and he slept in the woods last night.   Pt Nurse stated that Pt lives in [place] and needs a ride to get back to [facility]    Met f/f with Pt, he kept saying, "I am a mental health patient, I do not have my medication"    Pt kept saying for CM to call, [facility] and they will come and pick him up. Pt stated that he just got out of the Hospital on Wednesday.     Pt [phone] and [facility] could [facility]. 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     Pt stated that how he ended up here in [place] was he got into a total strangers car, her name was "Peaches"   Pt stated that Peaches put him out at a gas station.    Pt repeated several times, "I am a mental health Pt, I need to be in the hospital, I could hurt somebody or hurt myself"       Pt stated that Billie Lade is his friend and the lives in [place].

## 2022-04-29 NOTE — ED Triage Notes (Signed)
C/o left knee pain     Was traveling with friends when they left the patient yesterday in [place] without his medications and without food.

## 2022-04-29 NOTE — Other (Signed)
Comprehensive Assessment Form Part 1      Section I - Disposition      The Medical Doctor to Psychiatrist conference was notcompleted.  The Medical Doctor is in agreement with intake assessment.  The plan is discharge with resources.  The on-call Psychiatrist consulted was Dr. Donalynn Furlong.  The admitting Psychiatrist will be Dr. N/A.  The admitting Diagnosis is N/A.      This Clinical research associate reviewed the [place] Suicide Severity Rating Scale in nursing flowsheet and the risk level assigned is low risk.  Based on this assessment, the risk of suicide is low risk and the plan is see above.      BSMART assessment completed, and suicide risk level noted to be low. Primary Nurse Wilkie Aye and Charge Nurse N/A and NP Baird Lyons notified. Concerns not observed.    Security/Off-duty Officer has not been notified.      Section II - Integrated Summary  Summary:    Pt seen and assessed in ER 16.  Pt dressed in own clothing and appears stated age.  Pt presents to the ER with complaint of knee pain.  This Clinical research associate asked to see pt by primary nurse, Wilkie Aye, after making homicidal statement.  Pt resting in ER bed and had to be woken up to complete assessment.  Pt states he came to the ER for knee pain.  Pt states also that he was recently discharged from the [place] in [place] for mental problems and that his medications were 'lost' after discharge.  Pt reports historical diagnosis of PTSD and 'everything that comes under that.'  Pt also endorses polysubstance abuse.  Pt states his family is in [place] and he is trying to get back there.  Pt denies SI/AVH, however states he wants to hurt the 'girl that took all of his things.'  He does not know her name or how to contact her.  He states she does not live in [place].  Pt irritable and when asked if was looking to remain inpt for voluntary treatment, he said 'I didn't say that.  I just said I need to get home.  Get me a phone so I can call the [facility] people who will give me a ride there.'  Pt asked repeatedly if he  was seeking hospitalization and he denies.  At this time, pt not meeting criteria for inpt hospitalization or involuntary committment.  Baird Lyons NP and Dr Donalynn Furlong both aware and agree with disposition.       The patient has demonstrated mental capacity to provide informed consent.  The information is given by the patient.  The Chief Complaint is knee pain.  The Precipitant Factors are medication non-compliance.  Previous Hospitalizations: pt states last week in [place]  The patient has not previously been in restraints.  [facility]    Lethality Assessment:    The potential for suicide noted by the following: not noted.  The potential for homicide is noted by the following: wants to harm some 'woman' .  The patient has not been a perpetrator of sexual or physical abuse.  There are no pending charges per pt  The patient is not felt to be at risk for self harm or harm to others.  The attending nurse was advised  as such .    Section III - Psychosocial  The patient's overall mood and attitude is irritable.  Feelings of helplessness and hopelessness are not observed.  Generalized anxiety is not observed.  Panic  is not observed. Phobias are not observed.  Obsessive compulsive tendencies are not observed.      Section IV - Mental Status Exam  The patient's appearance is unkept and shows poor hygiene.  The patient's behavior is agitated and shows poor eye contact. The patient is oriented to time, place, person and situation.  The patient's speech shows no evidence of impairment.  The patient's mood is irritable.  The range of affect shows no evidence of impairment.  The patient's thought content demonstrates no evidence of impairment .  The thought process shows no evidence of impairment.  The patient's perception shows no evidence of impairment. The patient's memory shows no evidence of impairment.  The patient's appetite shows no evidence of impairment .  The patient's sleep shows no  evidence of impairment. The patient's insight is blaming.  The patient's judgement is psychologically impaired.      Section V - Substance Abuse  The patient is  using substances.  The patient is using tobacco smoked for greater than 10 years with last use on 04/29/2022, alcohol for greater than 10 years with last use on 04/28/2022, cannabis smoked for greater than 10 years with last use on 04/27/2022, cocaine  smoked for greater than 10 years with last use on 04/27/2022, and heroin  smoked for unknown with last use on 'I don't know'. The patient has experienced the following withdrawal symptoms: cravings.    Section VI - Living Arrangements  The patient Single.  The patient lives in the woods or 'where ever I want to stay'. The patient has no children.  The patient does not plan to return home upon discharge.  The patient does not have legal issues pending. 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  Religious and cultural practices have not been voiced at this time.    The patient's greatest support comes from sister and this person will not be involved with the treatment.    The patient has not been in an event described as horrible or outside the realm of ordinary life experience either currently or in the past.  The patient has not been a victim of sexual/physical abuse.    Section VII - Other Areas of Clinical Concern  The highest grade achieved is 12 with the overall quality of school experience being described as OK.  The patient is currently unemployed and speaks [place] as a primary language.  The patient has no communication impairments affecting communication. The patient's preference for learning can be described as: can read and write adequately.  The patient's hearing is normal.  The patient's vision is normal.      Katherine Basset, RN

## 2022-04-29 NOTE — ED Notes (Signed)
Pt moved to er 26 at this time, assuming care of pt     Dianna Rossetti, RN  04/29/22 1805

## 2022-04-30 MED ORDER — ACETAMINOPHEN 325 MG PO TABS
325 | Freq: Four times a day (QID) | ORAL | Status: DC | PRN
Start: 2022-04-30 — End: 2022-05-11
  Administered 2022-04-30: 12:00:00 650 mg via ORAL

## 2022-04-30 MED ORDER — IBUPROFEN 600 MG PO TABS
600 MG | ORAL | Status: AC
Start: 2022-04-30 — End: 2022-04-29
  Administered 2022-04-30: 02:00:00 600 mg via ORAL

## 2022-04-30 MED ORDER — ACETAMINOPHEN 160 MG/5ML PO SOLN
160 MG/5ML | ORAL | Status: DC | PRN
Start: 2022-04-30 — End: 2022-04-30

## 2022-04-30 NOTE — Behavioral Health Treatment Team (Addendum)
BH Initial Nurse Note:      Patient presented to the ER for c/o knee pain; patient contacted the [employer] and stated that he was suicidal and homicidal. Patient did not feel safe discharging and is not from [place]. Patient reported that he is from [place], [place] and currently does not have a way home after friends left him in [place].    Voluntary admit  UDS(+) Cocaine, THC    Patient is a 61 year-old male patient under the professional care of Dr. Lujean Amel; patient is covid (+) and is currently on the medical unit. Patient has psych hx of PTSD, Mood D/o, and polysubstance abuse. Reports medical hx of HTN and knee pain. Patient is cooperative during the behavioral health admission. Currently reports SI with no plan 1:1 staff is present to monitor for safety. Currently reports HI towards a woman named "Peaches" that he does not know. Writer asked patient the reason for wanting to harm "Peaches"; patient states, "She left me here in the city". States that he was left at a [employer] station and did not have his medication and was unable to eat and is unable to get home to [place]. Denies A/V hallucinations. Observed with flat affect with anxious and depressed mood; reports anxiety 7/10 and depression 8/10. Patient does not appear to be responding to internal stimuli. Reports last psych hospitalization last Wednesday at the [facility] in [place], . Patient is an Investment banker, operational and receives disability. Reports living alone and then states, "I live with my sister sometimes". When asked about family support; patient states, "I don't know I haven't heard from them". Patient continues to rest quietly in bed and was observed eating from a box lunch. 1:1 staff remains at bedside for safety.

## 2022-04-30 NOTE — Other (Signed)
Spoke with Dr. Lujean Amel who agreed to admit on Medical Floor for Psychiatry due to positive Covid.     Pt has a history of PTSD and Mood Disorder

## 2022-04-30 NOTE — Care Coordination-Inpatient (Signed)
04/30/22 0849   Service Assessment   Patient Orientation Alert and Oriented   Cognition Alert   History Provided By Patient   Primary Caregiver Self   Accompanied By/Relationship Pt alone during interview.   Support Systems Family Members   Patient's Healthcare Decision Maker is: Armed forces operational officer Next of Kin   PCP Verified by CM Yes  (Seen by MDs @ [facility] in [place].)   Last Visit to PCP Within last 6 months   Prior Functional Level Independent in ADLs/IADLs   Current Functional Level Independent in ADLs/IADLs   Can patient return to prior living arrangement Yes   Ability to make needs known: Good   Family able to assist with home care needs: Yes   Would you like for me to discuss the discharge plan with any other family members/significant others, and if so, who? Yes  Reino Kent)   Financial Resources Other (Comment)  (Self Py @ this time.)   Social/Functional History   Lives With Family  (Lives with sister.)   Type of Home House   Home Layout One level   Home Access Stairs to enter without rails   Entrance Stairs - Number of Steps 3 outside steps.   Bathroom Shower/Tub Human resources officer, standard   Receives Help From Family   ADL Assistance Independent   Homemaking Assistance Independent   Homemaking Responsibilities Yes   Ambulation Assistance Needs assistance  Warehouse manager.)   Copy No   Occupation On disability   Discharge Planning   Type of Residence House   Living Arrangements Family Members   Current Services Prior To Admission None   Potential Assistance Needed N/A   DME Ordered? No   Potential Assistance Purchasing Medications No   Type of Home Care Services None   Patient expects to be discharged to: House   One/Two Story Residence Two story   History of falls? 0   Services At/After Discharge   Transition of Care Consult (CM Consult) Discharge Planning   Services At/After  Discharge None   Veteran Resource Information Provided? No   Mode of Transport at Discharge Other (see comment)  (Sister to transport home.)   Confirm Follow Up Transport Family     CM spoke with pt via phone & D/C Plan is home with sister & sister to transport. Send R[facility] [place], [place] upon discharge. Uses cane/walker.

## 2022-04-30 NOTE — Plan of Care (Signed)
Problem: Self Harm/Suicidality  Goal: Will have no self-injury during hospital stay  Description: INTERVENTIONS:  1.  Ensure constant observer at bedside with Q15M safety checks  2.  Maintain a safe environment  3.  Secure patient belongings  4.  Ensure family/visitors adhere to safety recommendations  5.  Ensure safety tray has been added to patient's diet order  6.  Every shift and PRN: Re-assess suicidal risk via Frequent Screener    Outcome: Progressing     Problem: Anxiety  Goal: Will report anxiety at manageable levels  Description: INTERVENTIONS:  1. Administer medication as ordered  2. Teach and rehearse alternative coping skills  3. Provide emotional support with 1:1 interaction with staff  Outcome: Not Progressing     Problem: Depression/Self Harm  Goal: Effect of psychiatric condition will be minimized and patient will be protected from self harm  Description: INTERVENTIONS:  1. Assess impact of patient's symptoms on level of functioning, self care needs and offer support as indicated  2. Assess patient/family knowledge of depression, impact on illness and need for teaching  3. Provide emotional support, presence and reassurance  4. Assess for possible suicidal thoughts or ideation. If patient expresses suicidal thoughts or statements do not leave alone, initiate Suicide Precautions, move to a room close to the nursing station and obtain sitter  5. Initiate consults as appropriate with Mental Health Professional, Spiritual Care, Psychosocial CNS, and consider a recommendation to the LIP for a Psychiatric Consultation  Outcome: Progressing

## 2022-04-30 NOTE — ED Notes (Signed)
Report called to Daniel, Charity fundraiser. All questions answered.     Philippa Sicks, RN  04/30/22 [phone]

## 2022-05-01 MED ORDER — HYDROXYZINE PAMOATE 25 MG PO CAPS
25 | Freq: Three times a day (TID) | ORAL | Status: DC | PRN
Start: 2022-05-01 — End: 2022-05-11
  Administered 2022-05-04 – 2022-05-06 (×2): 25 mg via ORAL

## 2022-05-01 MED ORDER — SERTRALINE HCL 50 MG PO TABS
50 | Freq: Every day | ORAL | Status: DC
Start: 2022-05-01 — End: 2022-05-11
  Administered 2022-05-01 – 2022-05-11 (×11): 50 mg via ORAL

## 2022-05-01 MED ORDER — TRAZODONE HCL 50 MG PO TABS
50 MG | Freq: Every evening | ORAL | Status: AC | PRN
Start: 2022-05-01 — End: 2022-05-11
  Administered 2022-05-02 – 2022-05-03 (×2): 50 mg via ORAL

## 2022-05-01 MED ORDER — AMLODIPINE BESYLATE 2.5 MG PO TABS
2.5 | Freq: Every day | ORAL | Status: DC
Start: 2022-05-01 — End: 2022-05-11
  Administered 2022-05-02 – 2022-05-10 (×10): 2.5 mg via ORAL
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
## 2022-05-01 NOTE — Behavioral Health Treatment Team (Incomplete)
PSA PART II ADDITIONAL INFORMATION        Access To Fire Arms: {Yes Comment / No :52386}    Substance Use: Yes    Last Use: 8.4.23    Type of Substance: marijuana, alcohol, and cocaine    Frequency of Use:     Request to See Chaplain: {YES/NO:19726}    If yes, notified Chaplain: {YES/NO:19726}    Guardian:{YES/NO:19726}    Guardian Contact:***    Release of Information Signed: {YES/NO:19726}    Release of Information Signed For: ***
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
## 2022-05-01 NOTE — Progress Notes (Signed)
PSYCHIATRIC PROGRESS NOTE         Patient Name  Andrew Hester   Date of Birth 01-13-1961   CSN [id number]   Medical Record Number  [id number]      Age  61 y.o.   PCP None None   Admit date:  04/29/2022    Room Number  402/01   [facility]   Date of Service  05/01/2022            HISTORY OF PRESENT ILLNESS/INTERVAL HISTORY:              MENTAL STATUS EXAM & VITALS                    VITALS:     Patient Vitals for the past 24 hrs:   Temp Pulse Resp BP SpO2   05/01/22 1432 98.1 F (36.7 C) 58 18 (!) 144/84 100 %   05/01/22 0800 97.9 F (36.6 C) 70 18 (!) 144/93 96 %   04/30/22 1942 98.4 F (36.9 C) 73 18 (!) 153/89 96 %     Wt Readings from Last 3 Encounters:   04/29/22 74.8 kg (165 lb)     Temp Readings from Last 3 Encounters:   05/01/22 98.1 F (36.7 C) (Oral)     BP Readings from Last 3 Encounters:   05/01/22 (!) 144/84     Pulse Readings from Last 3 Encounters:   05/01/22 58            DATA     LABORATORY DATA:(reviewed/updated 05/01/2022)  No results found for this or any previous visit (from the past 24 hour(s)).   No results found for: VALAC, VALP, CARB2  No results found for: LITHM   RADIOLOGY REPORTS:(reviewed/updated 05/01/2022)  XR KNEE LEFT (3 VIEWS)    Result Date: 04/29/2022  EXAM: XR KNEE LEFT (3 VIEWS) INDICATION: pain. COMPARISON: None. FINDINGS: Three views of the left knee demonstrate no fracture or other acute osseous or articular abnormality. 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     No acute abnormality.           MEDICATIONS     ALL MEDICATIONS:   Current Facility-Administered Medications   Medication Dose Route Frequency    sertraline (ZOLOFT) tablet 50 mg  50 mg Oral Daily    traZODone (DESYREL) tablet 50 mg  50 mg Oral Nightly PRN    hydrOXYzine pamoate (VISTARIL) capsule 25 mg  25 mg Oral TID PRN    acetaminophen (TYLENOL) tablet 650 mg  650 mg Oral Q6H PRN      SCHEDULED MEDICATIONS:    sertraline  50 mg Oral Daily           ASSESSMENT & PLAN   Progress note    Progress date  05/01/2022 patient is seen for follow-up chart reviewed as a one-to-one staff status exam patient is calm and polite alert verbal denies suicidal thoughts today      Ricki Rodriguez he says he has some [facility] with the [facility] and they was hoping that they will provide some resources for him to return back to [facility] area patient is compliant with the medication no irritability to no anger affect flat oriented to 3 spheres in the context and he will go back to [place] continued inpatient level of care indicated for appropriate safe discharge plan to the community thank you  Continue close observation  Discussed meds  Provided support, psycho edu  Discussed with staff in  the treatment team, the progress made in therapy, psychosocial needs and nursing concerns.    The following regarding medications was addressed during rounds with patient:   the risks and benefits of the proposed medication.   The patient was given the opportunity to ask questions.   Informed consent given to the use of the above medications.     Obtain psychiatric records from previous psych hospitals to further elucidate the nature of patient's psychopathology and review once available.    Gather additional collateral information from friends, family and o/p treatment team to further elucidate the nature of patient's psychopathology and baselline level of psychiatric functioning.         I certify that this patient's inpatient psychiatric hospital services continue to be, required for treatment that could reasonably be expected to improve the patient's condition and that the patient continues to need, on a daily basis, active treatment furnished directly by or requiring the supervision of inpatient psychiatric facility personnel. In addition, the hospital records show that services furnished were intensive treatment services.    Signed By:   Laray Anger, MD  05/01/2022
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
## 2022-05-02 NOTE — Behavioral Health Treatment Team (Signed)
Pt seen by thiks Nurse about 1445.  Pt in room sitting on bed with assigned one to one staff present on the room.  Pt observed as polite cooperative and social.  Pt reported feeoing safe in the hospital no suicidal ideation.  Pt currently working with case manager for discharge planning continuing treatment for substance abuse after current hospitalization.  No comlaints voiced by the patient.  Continue one to one supervision to maintain pt safety.

## 2022-05-02 NOTE — Care Coordination-Inpatient (Signed)
05/02/22 1431   ITP   Date of Plan 05/02/22   Date of Next Review 05/09/22   Primary Diagnosis Code Suicidal ideation R45.851   Barriers to Treatment Transportation;Other (comment)  ([place] in another state, substance use)   Strengths Incorporated in Plan Acknowledging need for assistance;Community supports;Family supports;Postive outlook;Seeking interactions;Verbal   Plan of Care   Long Term Goal (LTG) Stated in patient/guardian terms "to get better and get into a program"   Short Term Goal 1   Short Term Goal 1 Client will learn and demonstrate effective coping skills   Baseline Functioning Pt currently using negative coping skills with substance use   Target Pt will be able to learn and demonstrate healthy coping skills   Objectives Client will participate in individual therapy   Intervention 1 Acknowledge client strengths   Frequency daily   Measured by Self report;Staff observation   Staff Responsible [facility] staff   Intervention 2 Indvidual therapy   Frequency daily   Measured by Self report;Staff observation   Staff Responsible [facility] staff   Intervention 3 Referral to community services   Frequency prior to discharge   Measured by Behavioral data   Staff Responsible [facility] staff   STG Goal 1 Status: Patient Appears to be  Progressing toward treatment plan goal   Short Term Goal 2   Short Term Goal 2 Client will learn and demonstrate improved bourndaries   Baseline Functioning Pt reported that he "gets used" by others   Target Pt will learn and verbally set healthy boundaries with others   Objectives Client will participate in individual therapy   Intervention 1 Acknowledge client strengths   Frequency daily   Measured by Self report;Staff observation   Staff Responsible [facility] staff   Intervention 2 Indvidual therapy   Frequency daily   Measured by Self report;Staff observation   Staff Responsible [facility] staff   Intervention 3 Other (comment)  (worksheets)   Frequency daily   Measured by Self report;Behavioral data    Staff Responsible [facility] staff   STG Goal 2 Status: Patient Appears to be  Progressing toward treatment plan goal   Crisis/Safety/Discharge Plan   Crisis/Safety Plan Standard program interventions and protocol   Comprehensive Assessment Completion Date 05/02/22   Discharge Plan to stabilize and step down to an inpatient substance use treatment program

## 2022-05-02 NOTE — Plan of Care (Signed)
Problem: Discharge Planning  Goal: Discharge to home or other facility with appropriate resources  Outcome: Progressing     Problem: Pain  Goal: Verbalizes/displays adequate comfort level or baseline comfort level  Outcome: Progressing     Problem: Self Harm/Suicidality  Goal: Will have no self-injury during hospital stay  Description: INTERVENTIONS:  1.  Ensure constant observer at bedside with Q15M safety checks  2.  Maintain a safe environment  3.  Secure patient belongings  4.  Ensure family/visitors adhere to safety recommendations  5.  Ensure safety tray has been added to patient's diet order  6.  Every shift and PRN: Re-assess suicidal risk via Frequent Screener    Outcome: Progressing     Problem: Anxiety  Goal: Will report anxiety at manageable levels  Description: INTERVENTIONS:  1. Administer medication as ordered  2. Teach and rehearse alternative coping skills  3. Provide emotional support with 1:1 interaction with staff  Outcome: Progressing     Problem: Depression/Self Harm  Goal: Effect of psychiatric condition will be minimized and patient will be protected from self harm  Description: INTERVENTIONS:  1. Assess impact of patient's symptoms on level of functioning, self care needs and offer support as indicated  2. Assess patient/family knowledge of depression, impact on illness and need for teaching  3. Provide emotional support, presence and reassurance  4. Assess for possible suicidal thoughts or ideation. If patient expresses suicidal thoughts or statements do not leave alone, initiate Suicide Precautions, move to a room close to the nursing station and obtain sitter  5. Initiate consults as appropriate with Mental Health Professional, Spiritual Care, Psychosocial CNS, and consider a recommendation to the [facility]IP for a Psychiatric Consultation  Outcome: Progressing     Problem: Safety - Adult  Goal: Free from fall injury  Outcome: Progressing

## 2022-05-02 NOTE — Progress Notes (Addendum)
Bedside and Verbal shift change report given to Jobe Marker RN (oncoming nurse) by Guadlupe Spanish RN  (offgoing nurse). Report included the following information Nurse Handoff Report.

## 2022-05-02 NOTE — Behavioral Health Treatment Team (Signed)
0200 patient noted in bed resting with eyes closed, 1:1 staff at bedside reporting uneventful shift. Admitted on the 6th with c/o SI/HI although initial complaint knee pain. 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 UDS +cocaine +THC. COVID+, asymptomatic. Patient is to remain on 1:1 & Droplet+ Precautions. Continue self harm/suicidiality, anxiety, depression/self harm care plan. Discharge plan back to [place] c/o family.

## 2022-05-02 NOTE — Progress Notes (Signed)
PSYCHIATRIC PROGRESS NOTE         Patient Name  Andrew Hester   Date of Birth Feb 23, 1961   CSN [id number]   Medical Record Number  [id number]      Age  61 y.o.   PCP None None   Admit date:  04/29/2022    Room Number  402/01   [facility]   Date of Service  05/02/2022            HISTORY OF PRESENT ILLNESS/INTERVAL HISTORY:              MENTAL STATUS EXAM & VITALS                    VITALS:     Patient Vitals for the past 24 hrs:   Temp Pulse Resp BP SpO2   05/02/22 1954 98.3 F (36.8 C) 62 19 124/76 97 %   05/02/22 1659 98.3 F (36.8 C) 68 18 127/79 98 %   05/02/22 0726 97.7 F (36.5 C) 55 18 (!) 140/83 93 %     Wt Readings from Last 3 Encounters:   04/29/22 74.8 kg (165 lb)     Temp Readings from Last 3 Encounters:   05/02/22 98.3 F (36.8 C) (Oral)     BP Readings from Last 3 Encounters:   05/02/22 124/76     Pulse Readings from Last 3 Encounters:   05/02/22 62            DATA     LABORATORY DATA:(reviewed/updated 05/02/2022)  No results found for this or any previous visit (from the past 24 hour(s)).   No results found for: VALAC, VALP, CARB2  No results found for: LITHM   RADIOLOGY REPORTS:(reviewed/updated 05/02/2022)  XR KNEE LEFT (3 VIEWS)    Result Date: 04/29/2022  EXAM: XR KNEE LEFT (3 VIEWS) INDICATION: pain. COMPARISON: None. FINDINGS: Three views of the left knee demonstrate no fracture or other acute osseous or articular abnormality. 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     No acute abnormality.           MEDICATIONS     ALL MEDICATIONS:   Current Facility-Administered Medications   Medication Dose Route Frequency    sertraline (ZOLOFT) tablet 50 mg  50 mg Oral Daily    traZODone (DESYREL) tablet 50 mg  50 mg Oral Nightly PRN    hydrOXYzine pamoate (VISTARIL) capsule 25 mg  25 mg Oral TID PRN    amLODIPine (NORVASC) tablet 2.5 mg  2.5 mg Oral Daily    acetaminophen (TYLENOL) tablet 650 mg  650 mg Oral Q6H PRN      SCHEDULED MEDICATIONS:    sertraline  50 mg Oral Daily    amLODIPine   2.5 mg Oral Daily           ASSESSMENT & PLAN   Progress note for 2023 patient seen for follow-up chart reviewed one-to-one status the patient sitting on a eating his meal alert verbal polite cooperative oriented coherent PET-avid denies suicidal thoughts he says he went to stay with in this area compliant the medication and not suicidal and also wanted to be helping with the placement transportation taking alert and oriented joking  Continue close observation  Discussed meds  Provided support, psycho edu  Discussed with staff in the treatment team, the progress made in therapy, psychosocial needs and nursing concerns.    The following regarding medications was addressed during rounds with patient:   the  risks and benefits of the proposed medication.   The patient was given the opportunity to ask questions.   Informed consent given to the use of the above medications.     Obtain psychiatric records from previous psych hospitals to further elucidate the nature of patient's psychopathology and review once available.    Gather additional collateral information from friends, family and o/p treatment team to further elucidate the nature of patient's psychopathology and baselline level of psychiatric functioning.         I certify that this patient's inpatient psychiatric hospital services continue to be, required for treatment that could reasonably be expected to improve the patient's condition and that the patient continues to need, on a daily basis, active treatment furnished directly by or requiring the supervision of inpatient psychiatric facility personnel. In addition, the hospital records show that services furnished were intensive treatment services.    Signed By:   Laray Anger, MD  05/02/2022

## 2022-05-03 NOTE — Behavioral Health Treatment Team (Signed)
Patient pleasant and cooperative, friendly and social.  Patient affect broad with good eye contact.  Patient denied SI, HI, AH, VH, depression, and anxiety.  Patient remains on one to one supervision to maintain patient safety.

## 2022-05-03 NOTE — Progress Notes (Signed)
PSYCHIATRIC PROGRESS NOTE         Patient Name  Andrew Hester   Date of Birth August 11, 1961   CSN [id number]   Medical Record Number  [id number]      Age  61 y.o.   PCP None None   Admit date:  04/29/2022    Room Number  402/01   [facility]   Date of Service  05/03/2022            HISTORY OF PRESENT ILLNESS/INTERVAL HISTORY:              MENTAL STATUS EXAM & VITALS                    VITALS:     Patient Vitals for the past 24 hrs:   Temp Pulse Resp BP SpO2   05/03/22 1917 98.4 F (36.9 C) 61 18 129/75 97 %   05/03/22 1533 97.9 F (36.6 C) 57 18 130/79 100 %   05/03/22 0749 98.6 F (37 C) 77 18 (!) 138/91 100 %     Wt Readings from Last 3 Encounters:   04/29/22 74.8 kg (165 lb)     Temp Readings from Last 3 Encounters:   05/03/22 98.4 F (36.9 C) (Oral)     BP Readings from Last 3 Encounters:   05/03/22 129/75     Pulse Readings from Last 3 Encounters:   05/03/22 61            DATA     LABORATORY DATA:(reviewed/updated 05/03/2022)  No results found for this or any previous visit (from the past 24 hour(s)).   No results found for: VALAC, VALP, CARB2  No results found for: LITHM   RADIOLOGY REPORTS:(reviewed/updated 05/03/2022)  XR KNEE LEFT (3 VIEWS)    Result Date: 04/29/2022  EXAM: XR KNEE LEFT (3 VIEWS) INDICATION: pain. COMPARISON: None. FINDINGS: Three views of the left knee demonstrate no fracture or other acute osseous or articular abnormality. 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     No acute abnormality.           MEDICATIONS     ALL MEDICATIONS:   Current Facility-Administered Medications   Medication Dose Route Frequency    sertraline (ZOLOFT) tablet 50 mg  50 mg Oral Daily    traZODone (DESYREL) tablet 50 mg  50 mg Oral Nightly PRN    hydrOXYzine pamoate (VISTARIL) capsule 25 mg  25 mg Oral TID PRN    amLODIPine (NORVASC) tablet 2.5 mg  2.5 mg Oral Daily    acetaminophen (TYLENOL) tablet 650 mg  650 mg Oral Q6H PRN      SCHEDULED MEDICATIONS:    sertraline  50 mg Oral Daily    amLODIPine   2.5 mg Oral Daily           ASSESSMENT & PLAN   Progress note    May 03, 2022 patient seen for follow-up chart reviewed spoke with the case manager patient seen in room 4 1 and has a one-to-one sitter patient sitting in the bed alert verbal polite cooperative and oriented to time hyperkalemic flat less animated than yesterday when engaging in conversation appreciates care he received compliant with medication and treatment code with asymptomatic suicidal or homicidal not psychotic case manager trying to reach out various veterans agencies the treatment team to facilitate an admission to a substance abuse inpatient rehabilitation program patient says he is    He want to make changes in his  life very much motivated continued inpatient treatment for safe appropriate discharge planning  Continue close observation  Discussed meds  Provided support, psycho edu  Discussed with staff in the treatment team, the progress made in therapy, psychosocial needs and nursing concerns.    The following regarding medications was addressed during rounds with patient:   the risks and benefits of the proposed medication.   The patient was given the opportunity to ask questions.   Informed consent given to the use of the above medications.     Obtain psychiatric records from previous psych hospitals to further elucidate the nature of patient's psychopathology and review once available.    Gather additional collateral information from friends, family and o/p treatment team to further elucidate the nature of patient's psychopathology and baselline level of psychiatric functioning.         I certify that this patient's inpatient psychiatric hospital services continue to be, required for treatment that could reasonably be expected to improve the patient's condition and that the patient continues to need, on a daily basis, active treatment furnished directly by or requiring the supervision of inpatient psychiatric facility personnel. In  addition, the hospital records show that services furnished were intensive treatment services.    Signed By:   Laray Anger, MD  05/03/2022

## 2022-05-03 NOTE — Behavioral Health Treatment Team (Signed)
Recreational Therapy    Provided pt with a journal and leisure packet

## 2022-05-03 NOTE — Behavioral Health Treatment Team (Signed)
Behavioral Health Treatment Team Note     Patient goal(s) for today: "to get into an inpatient treatment program"  Treatment team focus/goals: continue medication management, learn and utilize healthy coping skills and provide a safe discharge    Progress note: Pt presented with a bright, motivated affect and congruent mood. Pt denied any SI/HI/Avh at the time and was orientated x4. Pt shared that he was tired of this life-style and "needs to change now". Pt is looking forward to the inpatient substance use program. Writer spoke with pts doctor Dr. Tessa Lerner [phone]) and she shared that pt has a referral in for substance use treatment. She said that she is going to reach out to her team to see if she can coordinate the referral to go to the [facility]. Writer provided Dr. Wilson Singer with Randall Hiss number at the [facility] Dr. Wilson Singer shared that pt and writer would have to call the National Notification Number [phone] for the referral to the [facility].     LOS:  3  Expected LOS: 5-7 days    Insurance info/prescription coverage:  none [facility] but [facility]   Date of last family contact:  8.10.23  Family requesting physician contact today:  No  Discharge plan:  to stabilize and step down to an inpatient substance use treatment program  Guns in the home:  No   Outpatient provider(s):  Dr. Wilson Singer    Participating treatment team members: Cooper Render, * (assigned SW), Janalyn Harder, LMSW

## 2022-05-03 NOTE — Progress Notes (Signed)
Patient A+O x 4. Calm, pleasant and cooperative. Denies SI/HI. 1:1 in place.

## 2022-05-03 NOTE — Plan of Care (Signed)
Problem: Pain  Goal: Verbalizes/displays adequate comfort level or baseline comfort level  Outcome: Progressing     Problem: Self Harm/Suicidality  Goal: Will have no self-injury during hospital stay  Description: INTERVENTIONS:  1.  Ensure constant observer at bedside with Q15M safety checks  2.  Maintain a safe environment  3.  Secure patient belongings  4.  Ensure family/visitors adhere to safety recommendations  5.  Ensure safety tray has been added to patient's diet order  6.  Every shift and PRN: Re-assess suicidal risk via Frequent Screener    05/03/2022 2232 by Peterson Lombard, RN  Outcome: Progressing  05/03/2022 1746 by Rogue Jury, RN  Outcome: Progressing     Problem: Anxiety  Goal: Will report anxiety at manageable levels  Description: INTERVENTIONS:  1. Administer medication as ordered  2. Teach and rehearse alternative coping skills  3. Provide emotional support with 1:1 interaction with staff  Outcome: Progressing     Problem: Depression/Self Harm  Goal: Effect of psychiatric condition will be minimized and patient will be protected from self harm  Description: INTERVENTIONS:  1. Assess impact of patient's symptoms on level of functioning, self care needs and offer support as indicated  2. Assess patient/family knowledge of depression, impact on illness and need for teaching  3. Provide emotional support, presence and reassurance  4. Assess for possible suicidal thoughts or ideation. If patient expresses suicidal thoughts or statements do not leave alone, initiate Suicide Precautions, move to a room close to the nursing station and obtain sitter  5. Initiate consults as appropriate with Mental Health Professional, Spiritual Care, Psychosocial CNS, and consider a recommendation to the [facility] for a Psychiatric Consultation  Outcome: Progressing  Flowsheets (Taken 05/03/2022 2005 by Seth Bake Acord, RN)  Effect of psychiatric condition will be minimized and patient will be protected from self  harm: Provide emotional support, presence and reassurance     Problem: Safety - Adult  Goal: Free from fall injury  Outcome: Progressing

## 2022-05-03 NOTE — Behavioral Health Treatment Team (Signed)
Nursing Note    Patient is alert and oriented x 4.  He denies any SI/HI/AH/VH.  Patient denies any anxiety or depression.  Broad affect and calm mood.  Patient sitting on bed, watching TV.  1:1 sitter at bedside.  He denies any Covid-19 symptoms or pain.  Patient voiced no complaints.  Staff will continue to monitor patient for safety.

## 2022-05-04 NOTE — Progress Notes (Signed)
PSYCHIATRIC PROGRESS NOTE         Patient Name  Andrew Hester   Date of Birth March 17, 1961   CSN [id number]   Medical Record Number  [id number]      Age  61 y.o.   PCP None None   Admit date:  04/29/2022    Room Number  402/01   [facility]   Date of Service  05/04/2022            HISTORY OF PRESENT ILLNESS/INTERVAL HISTORY:              MENTAL STATUS EXAM & VITALS                    VITALS:     Patient Vitals for the past 24 hrs:   Temp Pulse Resp BP SpO2   05/04/22 0717 97.6 F (36.4 C) 63 18 131/87 100 %   05/04/22 0422 98.4 F (36.9 C) 61 18 129/75 97 %   05/04/22 0113 98 F (36.7 C) 59 18 117/70 100 %   05/03/22 1917 98.4 F (36.9 C) 61 18 129/75 97 %   05/03/22 1533 97.9 F (36.6 C) 57 18 130/79 100 %     Wt Readings from Last 3 Encounters:   04/29/22 74.8 kg (165 lb)     Temp Readings from Last 3 Encounters:   05/04/22 97.6 F (36.4 C) (Oral)     BP Readings from Last 3 Encounters:   05/04/22 131/87     Pulse Readings from Last 3 Encounters:   05/04/22 63            DATA     LABORATORY DATA:(reviewed/updated 05/04/2022)  No results found for this or any previous visit (from the past 24 hour(s)).   No results found for: VALAC, VALP, CARB2  No results found for: LITHM   RADIOLOGY REPORTS:(reviewed/updated 05/04/2022)  XR KNEE LEFT (3 VIEWS)    Result Date: 04/29/2022  EXAM: XR KNEE LEFT (3 VIEWS) INDICATION: pain. COMPARISON: None. FINDINGS: Three views of the left knee demonstrate no fracture or other acute osseous or articular abnormality. 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     No acute abnormality.           MEDICATIONS     ALL MEDICATIONS:   Current Facility-Administered Medications   Medication Dose Route Frequency    sertraline (ZOLOFT) tablet 50 mg  50 mg Oral Daily    traZODone (DESYREL) tablet 50 mg  50 mg Oral Nightly PRN    hydrOXYzine pamoate (VISTARIL) capsule 25 mg  25 mg Oral TID PRN    amLODIPine (NORVASC) tablet 2.5 mg  2.5 mg Oral Daily    acetaminophen (TYLENOL) tablet  650 mg  650 mg Oral Q6H PRN      SCHEDULED MEDICATIONS:    sertraline  50 mg Oral Daily    amLODIPine  2.5 mg Oral Daily           ASSESSMENT & PLAN   Progress note    May 04, 2022 patient seen for follow-up chart reviewed spoke with the    Case manager patient sitting on the bed alert verbal polite cooperative he says he has reviewed the [facility] brochure he is excited to go to the apparently had an appointment to interview them and he called the patient to call back is waiting for and no hallucinations no delusions polite depression much improved but anxious about disposition to admission to them  and also housing he is working with the case manager compliant with the medications no side effects continued inpatient level of care indicated for appropriate safe discharge planning into a substance abuse rehab program thank you  Continue close observation  Discussed meds  Provided support, psycho edu  Discussed with staff in the treatment team, the progress made in therapy, psychosocial needs and nursing concerns.    The following regarding medications was addressed during rounds with patient:   the risks and benefits of the proposed medication.   The patient was given the opportunity to ask questions.   Informed consent given to the use of the above medications.     Obtain psychiatric records from previous psych hospitals to further elucidate the nature of patient's psychopathology and review once available.    Gather additional collateral information from friends, family and o/p treatment team to further elucidate the nature of patient's psychopathology and baselline level of psychiatric functioning.         I certify that this patient's inpatient psychiatric hospital services continue to be, required for treatment that could reasonably be expected to improve the patient's condition and that the patient continues to need, on a daily basis, active treatment furnished directly by or requiring the supervision of  inpatient psychiatric facility personnel. In addition, the hospital records show that services furnished were intensive treatment services.    Signed By:   Laray Anger, MD  05/04/2022

## 2022-05-04 NOTE — Behavioral Health Treatment Team (Signed)
Behavioral Health Treatment Team Note     Patient goal(s) for today: "to do the intake assessment with the [facility]"  Treatment team focus/goals: continue medication management, psych-education on substance use and provide a safe discharge    Progress note: Pt was sleeping with the writer entered the room. The pt was easily woken up and pleasant to speak with. He denied any SI/HI/AVH at the time and was orientated x4. Writer provided the pt with the number to the [facility] and asked him to complete the intake assessment. Pt agreed to do the assessment. He looked over the materials the Clinical research associate provided and was able to provide positive feedback and insight on the program and the "road to being sober". Writer provided more educational readings on taking care of himself and the risks/tips to sobriety. An inpatient level of care is needed to coordinate a safe discharge    LOS:  4  Expected LOS: 5-7 days    Insurance info/prescription coverage:  none on file but [facility]  Date of last family contact:  8.10.23  Family requesting physician contact today:  No  Discharge plan:  to step down to a substance use program   Guns in the home:  No   Outpatient provider(s[facility]  [facility] [place]    Participating treatment team members: Cooper Render, * (assigned SW), Janalyn Harder, LMSW

## 2022-05-05 NOTE — Progress Notes (Signed)
Patient is calm and cooperative. Alert and oriented x 4. Denies any SI/HI. Asked for his PRN anxiety medication. Patient is lying on bed without any other complaints. 1:1 sitter at bedside. Will continue to monitor patient.

## 2022-05-05 NOTE — Plan of Care (Signed)
Problem: Discharge Planning  Goal: Discharge to home or other facility with appropriate resources  05/05/2022 1814 by Larey Dresser, RN  Outcome: Progressing  05/05/2022 1813 by Larey Dresser, RN  Outcome: Progressing     Problem: Pain  Goal: Verbalizes/displays adequate comfort level or baseline comfort level  05/05/2022 1814 by Larey Dresser, RN  Outcome: Progressing  05/05/2022 1813 by Larey Dresser, RN  Outcome: Progressing     Problem: Self Harm/Suicidality  Goal: Will have no self-injury during hospital stay  Description: INTERVENTIONS:  1.  Ensure constant observer at bedside with Q15M safety checks  2.  Maintain a safe environment  3.  Secure patient belongings  4.  Ensure family/visitors adhere to safety recommendations  5.  Ensure safety tray has been added to patient's diet order  6.  Every shift and PRN: Re-assess suicidal risk via Frequent Screener    05/05/2022 1814 by Larey Dresser, RN  Outcome: Progressing  05/05/2022 1813 by Larey Dresser, RN  Outcome: Progressing     Problem: Anxiety  Goal: Will report anxiety at manageable levels  Description: INTERVENTIONS:  1. Administer medication as ordered  2. Teach and rehearse alternative coping skills  3. Provide emotional support with 1:1 interaction with staff  05/05/2022 1814 by Larey Dresser, RN  Outcome: Progressing  05/05/2022 1813 by Larey Dresser, RN  Outcome: Progressing     Problem: Depression/Self Harm  Goal: Effect of psychiatric condition will be minimized and patient will be protected from self harm  Description: INTERVENTIONS:  1. Assess impact of patient's symptoms on level of functioning, self care needs and offer support as indicated  2. Assess patient/family knowledge of depression, impact on illness and need for teaching  3. Provide emotional support, presence and reassurance  4. Assess for possible suicidal thoughts or ideation. If patient expresses suicidal thoughts or statements do not leave alone, initiate Suicide  Precautions, move to a room close to the nursing station and obtain sitter  5. Initiate consults as appropriate with Mental Health Professional, Spiritual Care, Psychosocial CNS, and consider a recommendation to the [facility] for a Psychiatric Consultation  05/05/2022 1814 by Larey Dresser, RN  Outcome: Progressing  05/05/2022 1813 by Larey Dresser, RN  Outcome: Progressing     Problem: Safety - Adult  Goal: Free from fall injury  05/05/2022 1814 by Larey Dresser, RN  Outcome: Progressing  05/05/2022 1813 by Larey Dresser, RN  Outcome: Progressing

## 2022-05-06 NOTE — Plan of Care (Signed)
Problem: Discharge Planning  Goal: Discharge to home or other facility with appropriate resources  Outcome: Progressing     Problem: Pain  Goal: Verbalizes/displays adequate comfort level or baseline comfort level  Outcome: Progressing     Problem: Self Harm/Suicidality  Goal: Will have no self-injury during hospital stay  Description: INTERVENTIONS:  1.  Ensure constant observer at bedside with Q15M safety checks  2.  Maintain a safe environment  3.  Secure patient belongings  4.  Ensure family/visitors adhere to safety recommendations  5.  Ensure safety tray has been added to patient's diet order  6.  Every shift and PRN: Re-assess suicidal risk via Frequent Screener    Outcome: Progressing     Problem: Anxiety  Goal: Will report anxiety at manageable levels  Description: INTERVENTIONS:  1. Administer medication as ordered  2. Teach and rehearse alternative coping skills  3. Provide emotional support with 1:1 interaction with staff  Outcome: Progressing     Problem: Depression/Self Harm  Goal: Effect of psychiatric condition will be minimized and patient will be protected from self harm  Description: INTERVENTIONS:  1. Assess impact of patient's symptoms on level of functioning, self care needs and offer support as indicated  2. Assess patient/family knowledge of depression, impact on illness and need for teaching  3. Provide emotional support, presence and reassurance  4. Assess for possible suicidal thoughts or ideation. If patient expresses suicidal thoughts or statements do not leave alone, initiate Suicide Precautions, move to a room close to the nursing station and obtain sitter  5. Initiate consults as appropriate with Mental Health Professional, Spiritual Care, Psychosocial CNS, and consider a recommendation to the [facility]IP for a Psychiatric Consultation  Outcome: Progressing     Problem: Safety - Adult  Goal: Free from fall injury  Outcome: Progressing

## 2022-05-07 NOTE — Progress Notes (Incomplete)
PSYCHIATRIC PROGRESS NOTE         Patient Name  Andrew Hester   Date of Birth 07-31-1961   CSN [id number]   Medical Record Number  [id number]      Age  61 y.o.   PCP None None   Admit date:  04/29/2022    Room Number  402/01   [facility]   Date of Service  05/07/2022            HISTORY OF PRESENT ILLNESS/INTERVAL HISTORY:              MENTAL STATUS EXAM & VITALS                    VITALS:     Patient Vitals for the past 24 hrs:   Temp Pulse Resp BP SpO2   05/07/22 2012 98.1 F (36.7 C) 70 20 122/76 99 %   05/07/22 1612 98.4 F (36.9 C) 70 19 128/79 100 %   05/07/22 0828 97.9 F (36.6 C) 59 23 126/70 100 %   05/07/22 0414 97.7 F (36.5 C) 52 18 120/76 99 %     Wt Readings from Last 3 Encounters:   04/29/22 74.8 kg (165 lb)     Temp Readings from Last 3 Encounters:   05/07/22 98.1 F (36.7 C) (Oral)     BP Readings from Last 3 Encounters:   05/07/22 122/76     Pulse Readings from Last 3 Encounters:   05/07/22 70            DATA     LABORATORY DATA:(reviewed/updated 05/07/2022)  No results found for this or any previous visit (from the past 24 hour(s)).   No results found for: VALAC, VALP, CARB2  No results found for: LITHM   RADIOLOGY REPORTS:(reviewed/updated 05/07/2022)  XR KNEE LEFT (3 VIEWS)    Result Date: 04/29/2022  EXAM: XR KNEE LEFT (3 VIEWS) INDICATION: pain. COMPARISON: None. FINDINGS: Three views of the left knee demonstrate no fracture or other acute osseous or articular abnormality. 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     No acute abnormality.           MEDICATIONS     ALL MEDICATIONS:   Current Facility-Administered Medications   Medication Dose Route Frequency    sertraline (ZOLOFT) tablet 50 mg  50 mg Oral Daily    traZODone (DESYREL) tablet 50 mg  50 mg Oral Nightly PRN    hydrOXYzine pamoate (VISTARIL) capsule 25 mg  25 mg Oral TID PRN    amLODIPine (NORVASC) tablet 2.5 mg  2.5 mg Oral Daily    acetaminophen (TYLENOL) tablet 650 mg  650 mg Oral Q6H PRN      SCHEDULED  MEDICATIONS:    sertraline  50 mg Oral Daily    amLODIPine  2.5 mg Oral Daily           ASSESSMENT & PLAN     Continue close observation  Discussed meds  Provided support, psycho edu  Discussed with staff in the treatment team, the progress made in therapy, psychosocial needs and nursing concerns.    The following regarding medications was addressed during rounds with patient:   the risks and benefits of the proposed medication.   The patient was given the opportunity to ask questions.   Informed consent given to the use of the above medications.     Obtain psychiatric records from previous psych hospitals to further elucidate the nature of patient's  psychopathology and review once available.    Gather additional collateral information from friends, family and o/p treatment team to further elucidate the nature of patient's psychopathology and baselline level of psychiatric functioning.         I certify that this patient's inpatient psychiatric hospital services continue to be, required for treatment that could reasonably be expected to improve the patient's condition and that the patient continues to need, on a daily basis, active treatment furnished directly by or requiring the supervision of inpatient psychiatric facility personnel. In addition, the hospital records show that services furnished were intensive treatment services.    Signed By:   Laray Anger, MD  05/07/2022

## 2022-05-07 NOTE — Behavioral Health Treatment Team (Signed)
Behavioral Health Treatment Team Note     Patient goal(s) for today: "to get out of here"  Treatment team focus/goals: continue medication management, group therapy and provide a safe discharge    Progress note: Pt presented with a bright affect and congruent mood. Pt denied any SI/HI/Avh at the time and was orientated x4. Pt shared that he is excited to go to the [facility]. Writer informed the pt that he was accepted and that she reached out to Jackson at the [place] to send over the referral to the [facility]. Once that is sent, the [facility] will pick the pt up. An inpatient level of care is needed to coordinate a safe discharge.    Writer called and emailed Wilkie Aye at the [place] [phone] x [phone]     LOS:  7  Expected LOS: 10 days    [facility]  Date of last family contact:  8.14.23  Family requesting physician contact today:  No  Discharge plan:  to step down to the [facility] in the home:  No   [facility], [facility]    Participating treatment team members: Cooper Render, * (assigned SW), Janalyn Harder, LMSW

## 2022-05-07 NOTE — Other (Incomplete)
[employer] Liaison Team Note     LOS:  7     Patient goal(s) for today: ***  [employer] Liaison team focus/goals: ***    Progress note: This writer rounded on patient.  Patient *** to talk with this Clinical research associate and was informed of counselor's role.    The patient's appearance {APPEARANCE:40297}.  The patient's behavior {BEHAVIOR:40298}. The patient is {ORIENTATION:41442::"oriented to time, place, person and situation"}.  The patient's speech {SPEECH:40299}.  The patient's mood  {MOOD BH:40301}.  The range of affect {RANGE OF AFFECT:40302}.  The patient's thought content  demonstrates {THOUGHT CONTENT:40303}.  The thought process {THOUGHT PROCESS:40304}.  The patient's perception {PERCEPTION:41443}. The patient's memory {MEMORY BH:40306}.  The patient's appetite {APPETITIE:40307}.  The patient's sleep {SLEEP BH:40308}. {INSIGHT:40309}.  The patient's judgement {JUDGEMENT:40310}.  Patient *** suicidal and/or homicidal ideation.   The plan is ***.   Guns in the home: {YES / JK:09381}      Barriers to Discharge:   Outpatient provider(s):  ***  Insurance info/prescription coverage:  ***    Diagnosis: ***    Plan:  ***.   Follow up Psych Consult placed? {YES / WE:99371} .   Psychiatrist updated? {YES / IR:67893}        Participating treatment team members: Cooper Render, [place], ***

## 2022-05-08 NOTE — Behavioral Health Treatment Team (Signed)
Behavioral Health Treatment Team Note     Patient goal(s) for today: "to get into rehab"  Treatment team focus/goals: continue medication management, coping skills and provide a safe discharge    Progress note: Pt presented with a bright affect and "great" mood. Pt denied any SI/HI/Avh at the time and was orientated x4. Pts thoughts were clear, organized and goal orientated. Pts speech was clear and appropriate. [facility]. Writer faxed over the documents and will follow up with them before the end of the business day. An inpatient level of care is needed to coordinate a safe discharge due to high risk of relapse and pts safety.     LOS:  8  Expected LOS: 10 days    [facility] info[facility]pres[facility]  Date of last family contact:  8.14.23  Family requesting physician contact today:  No  Discharge plan:  to step down to an inpatient substance use treatment program  Guns in the home:  No   [facility]    Participating treatment team members: Cooper Render, * (assigned SW), Janalyn Harder, LMSW

## 2022-05-08 NOTE — Progress Notes (Signed)
PSYCHIATRIC PROGRESS NOTE         Patient Name  Andrew Hester   Date of Birth Jul 24, 1961   CSN [id number]   Medical Record Number  [id number]      Age  61 y.o.   PCP None None (Inactive)   Admit date:  04/29/2022    Room Number  402/01   [facility]   Date of Service  05/08/2022            HISTORY OF PRESENT ILLNESS/INTERVAL HISTORY:              MENTAL STATUS EXAM & VITALS                    VITALS:     Patient Vitals for the past 24 hrs:   Temp Pulse Resp BP SpO2   05/08/22 1913 98.1 F (36.7 C) 56 16 122/77 99 %   05/08/22 0809 98.1 F (36.7 C) 60 16 (!) 142/79 100 %   05/08/22 0807 98.3 F (36.8 C) 68 18 128/83 100 %   05/08/22 0500 98 F (36.7 C) 65 18 125/81 --     Wt Readings from Last 3 Encounters:   04/29/22 74.8 kg (165 lb)     Temp Readings from Last 3 Encounters:   05/08/22 98.1 F (36.7 C) (Oral)     BP Readings from Last 3 Encounters:   05/08/22 122/77     Pulse Readings from Last 3 Encounters:   05/08/22 56            DATA     LABORATORY DATA:(reviewed/updated 05/08/2022)  No results found for this or any previous visit (from the past 24 hour(s)).   No results found for: VALAC, VALP, CARB2  No results found for: LITHM   RADIOLOGY REPORTS:(reviewed/updated 05/08/2022)  XR KNEE LEFT (3 VIEWS)    Result Date: 04/29/2022  EXAM: XR KNEE LEFT (3 VIEWS) INDICATION: pain. COMPARISON: None. FINDINGS: Three views of the left knee demonstrate no fracture or other acute osseous or articular abnormality. 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     No acute abnormality.           MEDICATIONS     ALL MEDICATIONS:   Current Facility-Administered Medications   Medication Dose Route Frequency    sertraline (ZOLOFT) tablet 50 mg  50 mg Oral Daily    traZODone (DESYREL) tablet 50 mg  50 mg Oral Nightly PRN    hydrOXYzine pamoate (VISTARIL) capsule 25 mg  25 mg Oral TID PRN    amLODIPine (NORVASC) tablet 2.5 mg  2.5 mg Oral Daily    acetaminophen (TYLENOL) tablet 650 mg  650 mg Oral Q6H PRN       SCHEDULED MEDICATIONS:    sertraline  50 mg Oral Daily    amLODIPine  2.5 mg Oral Daily           ASSESSMENT & PLAN   Progress note    May 08, 2022 patient seen for follow-up chart reviewed spoke with the case manager patient remains alert and orientedno        Progress note.  Subjective the substance abuse program    Suicidal homicidal ideation or hallucinations although did express anxious somewhat frustrated taking [facility]      Continue close observation  Discussed meds  Provided support, psycho edu  Discussed with  staff in the treatment team, the progress made in therapy, psychosocial needs and nursing concerns.    The following regarding medications was addressed during rounds with patient:   the risks and benefits of the proposed medication.   The patient was given the opportunity to ask questions.   Informed consent given to the use of the above medications.     Obtain psychiatric records from previous psych hospitals to further elucidate the nature of patient's psychopathology and review once available.    Gather additional collateral information from friends, family and o/p treatment team to further elucidate the nature of patient's psychopathology and baselline level of psychiatric functioning.         I certify that this patient's inpatient psychiatric hospital services continue to be, required for treatment that could reasonably be expected to improve the patient's condition and that the patient continues to need, on a daily basis, active treatment furnished directly by or requiring the supervision of inpatient psychiatric facility personnel. In addition, the hospital records show that services furnished were intensive treatment services.    Signed By:   Laray Anger, MD  05/08/2022

## 2022-05-08 NOTE — Progress Notes (Signed)
Comprehensive Nutrition Assessment    Type and Reason for Visit:  Initial, RD Nutrition Re-Screen/LOS (LOS day 8)    Nutrition Recommendations/Plan:   Double protein portions   Please obtain an updated scaled wt as able   Continue to document PO intake in I/Os     Malnutrition Assessment:  Malnutrition Status:  No malnutrition (05/08/22 1421)    Context:  Acute Illness     Findings of the 6 clinical characteristics of malnutrition:  Energy Intake:  No significant decrease in energy intake  Weight Loss:  No significant weight loss     Body Fat Loss:  Unable to assess (NFPE deferred d/t droplet + contact iso.)     Muscle Mass Loss:  Unable to assess (NFPE deferred d/t droplet + contact iso.)    Fluid Accumulation:  No significant fluid accumulation    Grip Strength:  Not Performed    Nutrition Assessment:    Pt presents with knee pain, substance use, suicidal ideation, and is COVID +. Pt claimed that he is homeless per documentation. No significant wt changes per wt hx and pt report. Good intakes and appetite, consistently between 76 - 100% consumption of meals per flowsheets and pt report. Pt states that his intake has improved during this admission and reported that he is hungry and wants more food. Will add double protein portions. Labs: Cl 109, Gluc 102, Alk phos 123. Meds: Norvasc, Zoloft, Desyrel prn.    Nutrition Related Findings:    LBM 8/14, rash on back of neck per flowsheets, NFPE deferred d/t droplet + contact iso., no N/V/D/C nor C/S per pt report, no edema documented Wound Type: None       Current Nutrition Intake & Therapies:    Average Meal Intake: 76-100%  Average Supplements Intake: None Ordered  ADULT DIET; Regular; Disposable safety Tray, No Sharps ; patient states does not eat beef, double protein portions please    Anthropometric Measures:  Height: 180.3 cm (5' 10.98")  Ideal Body Weight (IBW): 172 lbs (78 kg)       Current Body Weight: 164 lb 14.5 oz (74.8 kg), 95.9 % IBW. Weight Source:  Stated  Current BMI (kg/m2): 23        Weight Adjustment For: No Adjustment                 BMI Categories: Normal Weight (BMI 18.5-24.9)    Estimated Daily Nutrient Needs:  Energy Requirements Based On: Kcal/kg  Weight Used for Energy Requirements: Current  Energy (kcal/day): 1870 - 4132 kcals/day (25 - 30 kcals/kg)  Weight Used for Protein Requirements: Current  Protein (g/day): 75 - 90 g/day (1 - 1/2 g/kg)  Method Used for Fluid Requirements: 1 ml/kcal  Fluid (ml/day): 1870 - 2244 ml/day (49m/kcal)    Nutrition Diagnosis:   No nutrition diagnosis at this time     Nutrition Interventions:   Food and/or Nutrient Delivery: Continue Current Diet  Nutrition Education/Counseling: No recommendation at this time  Coordination of Nutrition Care: Continue to monitor while inpatient  Plan of Care discussed with: pt, RN    Goals:     Goals: Meet at least 75% of estimated needs, by next RD assessment       Nutrition Monitoring and Evaluation:   Behavioral-Environmental Outcomes: None Identified  Food/Nutrient Intake Outcomes: Food and Nutrient Intake  Physical Signs/Symptoms Outcomes: None Identified    Discharge Planning:    Too soon to determine     EAudley Hose RD  Contact:  [id number]

## 2022-05-08 NOTE — Progress Notes (Signed)
PSYCHIATRIC PROGRESS NOTE         Patient Name  Andrew Hester   Date of Birth Apr 09, 1961   CSN [id number]   Medical Record Number  [id number]      Age  61 y.o.   PCP None None (Inactive)   Admit date:  04/29/2022    Room Number  402/01   [facility]   Date of Service  05/08/2022            HISTORY OF PRESENT ILLNESS/INTERVAL HISTORY:              MENTAL STATUS EXAM & VITALS                    VITALS:     Patient Vitals for the past 24 hrs:   Temp Pulse Resp BP SpO2   05/08/22 1913 98.1 F (36.7 C) 56 16 122/77 99 %   05/08/22 0809 98.1 F (36.7 C) 60 16 (!) 142/79 100 %   05/08/22 0807 98.3 F (36.8 C) 68 18 128/83 100 %   05/08/22 0500 98 F (36.7 C) 65 18 125/81 --     Wt Readings from Last 3 Encounters:   04/29/22 74.8 kg (165 lb)     Temp Readings from Last 3 Encounters:   05/08/22 98.1 F (36.7 C) (Oral)     BP Readings from Last 3 Encounters:   05/08/22 122/77     Pulse Readings from Last 3 Encounters:   05/08/22 56            DATA     LABORATORY DATA:(reviewed/updated 05/08/2022)  No results found for this or any previous visit (from the past 24 hour(s)).   No results found for: VALAC, VALP, CARB2  No results found for: LITHM   RADIOLOGY REPORTS:(reviewed/updated 05/08/2022)  XR KNEE LEFT (3 VIEWS)    Result Date: 04/29/2022  EXAM: XR KNEE LEFT (3 VIEWS) INDICATION: pain. COMPARISON: None. FINDINGS: Three views of the left knee demonstrate no fracture or other acute osseous or articular abnormality. 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     No acute abnormality.           MEDICATIONS     ALL MEDICATIONS:   Current Facility-Administered Medications   Medication Dose Route Frequency    sertraline (ZOLOFT) tablet 50 mg  50 mg Oral Daily    traZODone (DESYREL) tablet 50 mg  50 mg Oral Nightly PRN    hydrOXYzine pamoate (VISTARIL) capsule 25 mg  25 mg Oral TID PRN    amLODIPine (NORVASC) tablet 2.5 mg  2.5 mg Oral Daily    acetaminophen (TYLENOL) tablet 650 mg  650 mg Oral Q6H PRN       SCHEDULED MEDICATIONS:    sertraline  50 mg Oral Daily    amLODIPine  2.5 mg Oral Daily           ASSESSMENT & PLAN   Progress note    May 07, 2022 patient seen for follow-up chart reviewed discussed with the case manager patient remains alert and oriented x 3 spheres, cooperative anxious.  Further inpatient sessions after the behavioral medicine    Progress symptom indicated accepted for substance abuse rehab however.  Transection.  System to never approved the transfer to the point  Patient is frustrated but understands appreciate the staff    Not suicidal or homicidal.  His lack of support caseworker recently continue working with the police.  He continues to inpatient  substance  Continue close observation  Discussed meds  Provided support, psycho edu  Discussed with staff in the treatment team, the progress made in therapy, psychosocial needs and nursing concerns.    The following regarding medications was addressed during rounds with patient:   the risks and benefits of the proposed medication.   The patient was given the opportunity to ask questions.   Informed consent given to the use of the above medications.     Obtain psychiatric records from previous psych hospitals to further elucidate the nature of patient's psychopathology and review once available.    Gather additional collateral information from friends, family and o/p treatment team to further elucidate the nature of patient's psychopathology and baselline level of psychiatric functioning.         I certify that this patient's inpatient psychiatric hospital services continue to be, required for treatment that could reasonably be expected to improve the patient's condition and that the patient continues to need, on a daily basis, active treatment furnished directly by or requiring the supervision of inpatient psychiatric facility personnel. In addition, the hospital records show that services furnished were intensive treatment  services.    Signed By:   Laray Anger, MD  05/08/2022

## 2022-05-09 NOTE — Progress Notes (Incomplete)
PSYCHIATRIC PROGRESS NOTE         Patient Name  Andrew Hester   Date of Birth [birth year]   CSN [id number]   Medical Record Number  [id number]      Age  61 y.o.   PCP None None (Inactive)   Admit date:  04/29/2022    Room Number  402/01   [facility]   Date of Service  05/09/2022            HISTORY OF PRESENT ILLNESS/INTERVAL HISTORY:              MENTAL STATUS EXAM & VITALS                    VITALS:     Patient Vitals for the past 24 hrs:   Temp Pulse Resp BP SpO2   05/09/22 1934 97.9 F (36.6 C) 53 16 116/71 100 %   05/09/22 0912 98 F (36.7 C) 62 15 -- 100 %   05/09/22 0431 97.6 F (36.4 C) 62 16 124/67 100 %     Wt Readings from Last 3 Encounters:   04/29/22 74.8 kg (165 lb)     Temp Readings from Last 3 Encounters:   05/09/22 97.9 F (36.6 C) (Oral)     BP Readings from Last 3 Encounters:   05/09/22 116/71     Pulse Readings from Last 3 Encounters:   05/09/22 53            DATA     LABORATORY DATA:(reviewed/updated 05/09/2022)  No results found for this or any previous visit (from the past 24 hour(s)).   No results found for: VALAC, VALP, CARB2  No results found for: LITHM   RADIOLOGY REPORTS:(reviewed/updated 05/09/2022)  XR KNEE LEFT (3 VIEWS)    Result Date: 04/29/2022  EXAM: XR KNEE LEFT (3 VIEWS) INDICATION: pain. COMPARISON: None. FINDINGS: Three views of the left knee demonstrate no fracture or other acute osseous or articular abnormality. 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     No acute abnormality.           MEDICATIONS     ALL MEDICATIONS:   Current Facility-Administered Medications   Medication Dose Route Frequency    sertraline (ZOLOFT) tablet 50 mg  50 mg Oral Daily    traZODone (DESYREL) tablet 50 mg  50 mg Oral Nightly PRN    hydrOXYzine pamoate (VISTARIL) capsule 25 mg  25 mg Oral TID PRN    amLODIPine (NORVASC) tablet 2.5 mg  2.5 mg Oral Daily    acetaminophen (TYLENOL) tablet 650 mg  650 mg Oral Q6H PRN      SCHEDULED MEDICATIONS:    sertraline  50 mg Oral Daily     amLODIPine  2.5 mg Oral Daily           ASSESSMENT & PLAN   Progress note for the May 09, 2022 patient seen for follow-up chart reviewed spoke with case manager patient remains alert and oriented polite cooperative articulate understand what is going apparently the processes at the [facility] the local closest to be responsible for him he understood it.  And the case manager came and talked to him again explained continued inpatient level of care indicated for safe discharge planning thank you        On the left side of the bed              Continue close observation  Discussed meds  Provided support,  psycho edu  Discussed with staff in the treatment team, the progress made in therapy, psychosocial needs and nursing concerns.    The following regarding medications was addressed during rounds with patient:   the risks and benefits of the proposed medication.   The patient was given the opportunity to ask questions.   Informed consent given to the use of the above medications.     Obtain psychiatric records from previous psych hospitals to further elucidate the nature of patient's psychopathology and review once available.    Gather additional collateral information from friends, family and o/p treatment team to further elucidate the nature of patient's psychopathology and baselline level of psychiatric functioning.         I certify that this patient's inpatient psychiatric hospital services continue to be, required for treatment that could reasonably be expected to improve the patient's condition and that the patient continues to need, on a daily basis, active treatment furnished directly by or requiring the supervision of inpatient psychiatric facility personnel. In addition, the hospital records show that services furnished were intensive treatment services.    Signed By:   Laray Anger, MD  05/09/2022

## 2022-05-09 NOTE — Progress Notes (Incomplete)
PSYCHIATRIC PROGRESS NOTE         Patient Name  Andrew Hester   Date of Birth 11-Nov-1960   CSN [id number]   Medical Record Number  [id number]      Age  61 y.o.   PCP None None (Inactive)   Admit date:  04/29/2022    Room Number  402/01   [facility]   Date of Service  05/09/2022            HISTORY OF PRESENT ILLNESS/INTERVAL HISTORY:              MENTAL STATUS EXAM & VITALS                    VITALS:     Patient Vitals for the past 24 hrs:   Temp Pulse Resp BP SpO2   05/09/22 1934 97.9 F (36.6 C) 53 16 116/71 100 %   05/09/22 0912 98 F (36.7 C) 62 15 -- 100 %   05/09/22 0431 97.6 F (36.4 C) 62 16 124/67 100 %     Wt Readings from Last 3 Encounters:   04/29/22 74.8 kg (165 lb)     Temp Readings from Last 3 Encounters:   05/09/22 97.9 F (36.6 C) (Oral)     BP Readings from Last 3 Encounters:   05/09/22 116/71     Pulse Readings from Last 3 Encounters:   05/09/22 53            DATA     LABORATORY DATA:(reviewed/updated 05/09/2022)  No results found for this or any previous visit (from the past 24 hour(s)).   No results found for: VALAC, VALP, CARB2  No results found for: LITHM   RADIOLOGY REPORTS:(reviewed/updated 05/09/2022)  XR KNEE LEFT (3 VIEWS)    Result Date: 04/29/2022  EXAM: XR KNEE LEFT (3 VIEWS) INDICATION: pain. COMPARISON: None. FINDINGS: Three views of the left knee demonstrate no fracture or other acute osseous or articular abnormality. 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     No acute abnormality.           MEDICATIONS     ALL MEDICATIONS:   Current Facility-Administered Medications   Medication Dose Route Frequency   . sertraline (ZOLOFT) tablet 50 mg  50 mg Oral Daily   . traZODone (DESYREL) tablet 50 mg  50 mg Oral Nightly PRN   . hydrOXYzine pamoate (VISTARIL) capsule 25 mg  25 mg Oral TID PRN   . amLODIPine (NORVASC) tablet 2.5 mg  2.5 mg Oral Daily   . acetaminophen (TYLENOL) tablet 650 mg  650 mg Oral Q6H PRN      SCHEDULED MEDICATIONS:   . sertraline  50 mg Oral Daily   .  amLODIPine  2.5 mg Oral Daily           ASSESSMENT & PLAN   Progress note for the May 09, 2022 patient seen for follow-up chart reviewed spoke with case manager patient remains alert and oriented polite cooperative articulate understand what is going apparently the processes at the [facility] the local closest to be responsible for him he understood it.  And the case manager came and talked to him again explained continued inpatient level of care indicated for safe discharge planning thank you        On the left side of the bed              Continue close observation  Discussed meds  Provided support,  psycho edu  Discussed with staff in the treatment team, the progress made in therapy, psychosocial needs and nursing concerns.    The following regarding medications was addressed during rounds with patient:   the risks and benefits of the proposed medication.   The patient was given the opportunity to ask questions.   Informed consent given to the use of the above medications.     Obtain psychiatric records from previous psych hospitals to further elucidate the nature of patient's psychopathology and review once available.    Gather additional collateral information from friends, family and o/p treatment team to further elucidate the nature of patient's psychopathology and baselline level of psychiatric functioning.         I certify that this patient's inpatient psychiatric hospital services continue to be, required for treatment that could reasonably be expected to improve the patient's condition and that the patient continues to need, on a daily basis, active treatment furnished directly by or requiring the supervision of inpatient psychiatric facility personnel. In addition, the hospital records show that services furnished were intensive treatment services.    Signed By:   Laray Anger, MD  05/09/2022

## 2022-05-09 NOTE — Behavioral Health Treatment Team (Addendum)
Writer spoke with [place] from the [facility] and she shared that they are going to be the ones to coordinate the care for the pt. She asked if the writer can send over more clinicals and lab work to the clinical command team. She informed the Clinical research associate that she has never worked a case like this and will follow up with their team. Meriam Sprague will Regulatory affairs officer back with information.      Writer checked with nursing staff about pts dietary needs and if he has any restrictions with food. She was informed that the pt does not have diabetic medications and no meat. Writer also confirmed with pt and he confirmed no allergies or diabetes. Writer provided the pt with a treat bag.

## 2022-05-09 NOTE — Behavioral Health Treatment Team (Signed)
Behavioral Health Treatment Team Note     Patient goal(s) for today: "to get into a program"  Treatment team focus/goals: continue medication management, psych-education on substance use and provide a safe discharge    Progress note: Pt presented with a bright affect and "motivated" mood. Pt denied any SI/HI/Avh at the time and was orientated x4. Pts thoughts were clear, organized and goal orientated. His speech was clear and appropriate. He said "I'm not giving up on this and I hope it doesn't fall through". Pt continues to be patient in waiting to hear from the [place]. Writer informed him that the [facility] is now working on his case. Pt laughed and said "I hope they are better then [place]". Writer assured him that [place] at the [place] is working on trying to help him get into a substance use program. Pt shared that he appreciates everyone helping him and that this "is a true blessing". Pt said "I could've been killed out there, or in jail, or Od'ed without ya'll". Writer spoke with the admissions from the [facility] and they are tracking the progress with the pt. An inpatient level of care is needed to coordinate a safe discharge due to high risk of relapse and pts safety    LOS:  9  Expected LOS: 10-15 days    [facility]  Date of last family contact:  8.16.23  Family requesting physician contact today:  No  Discharge plan:  to step down to the [facility] in the home:  No   [facility]    Participating treatment team members: Cooper Render, * (assigned SW), Janalyn Harder, LMSW

## 2022-05-10 NOTE — Care Coordination-Inpatient (Signed)
Patient is medically clear. CM spoke with Dr. Lujean Amel. Plan is for patient to discharge tomorrow to [facility]. [facility] will handle discharge.

## 2022-05-10 NOTE — Behavioral Health Treatment Team (Signed)
Behavioral Health Treatment Team Note     Patient goal(s) for today: "to work on the new me"  Treatment team focus/goals: continue medication management, maintain ADLs and provide a safe discharge    Progress note: Pt presented with a broad affect and "couldn't be better" mood. Pt denied any SI/HI/AVH at the time and is orientated x4. Pt was up smiling and laughing with the Barista. Writer [facility] and [facility] said "I truly truly appreciate you and [place] not giving up on me". Pt said "I feel they passed the buck because I have been in other programs before but I'm ready". He stated he was to old and has to much to live for to be "playin these games". Pt is highly motivated and tested negative for Covid. Pt was informed that if he is not able to leave tomorrow, Dr. Lujean Amel will have him transferred to [address] [place] for a direct admission to the [facility]. Pt agreed with the plan and was able to process the risk factors of him leaving before being placed in the program. Pt said "I'm going to show [place] how wrong they were about me". Pt will be scheduled a [facility]. An inpatient level of care is needed to coordinate a safe discharge due to high risk of relapse and pts safety    LOS:  10  Expected LOS: 15 days    Ins[facility] info/prescription [facility]  [facility]  Date of last family contact:  8.17.23  Family requesting physician contact today:  No  Discharge plan:  [facility]  Guns in the home:  No   [facility]    Participating treatment team members: Cooper Render, * (assigned SW), Janalyn Harder, LMSW

## 2022-05-11 ENCOUNTER — Inpatient Hospital Stay
Admit: 2022-05-11 | Discharge: 2022-05-16 | Disposition: A | Discharge status: Other Acute Inpatient Hospital | Payer: Medicaid (Managed Care) | Source: Other Acute Inpatient Hospital | Attending: Addiction Medicine | Admitting: Addiction Medicine

## 2022-05-11 DIAGNOSIS — F39 Unspecified mood [affective] disorder: Secondary | ICD-10-CM

## 2022-05-11 MED ORDER — ALUM & MAG HYDROXIDE-SIMETH 200-200-20 MG/5ML PO SUSP
200-200-20 MG/5ML | Freq: Four times a day (QID) | ORAL | Status: AC | PRN
Start: 2022-05-11 — End: 2022-05-16

## 2022-05-11 MED ORDER — TRAZODONE HCL 50 MG PO TABS
50 MG | Freq: Every evening | ORAL | Status: AC | PRN
Start: 2022-05-11 — End: 2022-05-16
  Administered 2022-05-14: 01:00:00 50 mg via ORAL

## 2022-05-11 MED ORDER — SERTRALINE HCL 50 MG PO TABS
50 | Freq: Every day | ORAL | Status: DC
Start: 2022-05-11 — End: 2022-05-16
  Administered 2022-05-12 – 2022-05-16 (×5): 50 mg via ORAL

## 2022-05-11 MED ORDER — HYDROXYZINE HCL 50 MG PO TABS
50 MG | Freq: Three times a day (TID) | ORAL | Status: AC | PRN
Start: 2022-05-11 — End: 2022-05-16
  Administered 2022-05-13: 20:00:00 50 mg via ORAL

## 2022-05-11 MED ORDER — MAGNESIUM HYDROXIDE 400 MG/5ML PO SUSP
400 MG/5ML | Freq: Every day | ORAL | Status: AC | PRN
Start: 2022-05-11 — End: 2022-05-16
  Administered 2022-05-16: 08:00:00 30 mL via ORAL

## 2022-05-11 MED ORDER — AMLODIPINE BESYLATE 5 MG PO TABS
5 | Freq: Every day | ORAL | Status: DC
Start: 2022-05-11 — End: 2022-05-16
  Administered 2022-05-12 – 2022-05-16 (×5): 2.5 mg via ORAL

## 2022-05-11 MED ORDER — ACETAMINOPHEN 325 MG PO TABS
325 | ORAL | Status: DC | PRN
Start: 2022-05-11 — End: 2022-05-16
  Administered 2022-05-14 – 2022-05-15 (×2): 650 mg via ORAL

## 2022-05-11 MED ORDER — PRAZOSIN HCL 1 MG PO CAPS
1 | Freq: Every evening | ORAL | Status: DC
Start: 2022-05-11 — End: 2022-05-16
  Administered 2022-05-12 – 2022-05-16 (×5): 2 mg via ORAL

## 2022-05-11 NOTE — Behavioral Health Treatment Team (Signed)
BH Shift Note:    Andrew Hester was alert and oriented times 4 at the onset of the shift in the dayroom associating well with his peers.  He denied thoughts to harm himself and others.  He stated that he was not hallucinating or experiencing depression.  He is currently in bed with his eyes closed with even and unlabored breathing.  Staff will continue to monitor.

## 2022-05-11 NOTE — Progress Notes (Signed)
Verbal order received from Dr. Lujean Amel that 1:1 can be discontinued

## 2022-05-11 NOTE — Progress Notes (Signed)
Bedside shift report was given to Shakila, RN.

## 2022-05-11 NOTE — Behavioral Health Treatment Team (Signed)
Behavioral Health Treatment Team Note     Patient goal(s) for today: "to get to the program"   Treatment team focus/goals: continue medication management, group therapy and provide a safe discharge    Progress note: Pt presented with a broad affect and congruent mood. Pt denied any SI/HI/AVH at the time and was orientated x4. He shared that he is "worried" about being discharged without getting into a program. He said "I just know I won't be able to make it clean on the streets". Writer assured the pt that his treatment team here and at the [place] are doing everything they can to help him get into a residential program. Writer informed the pt that his assessment would have to be pushed back to Monday or Tuesday and pt agreed to stay. He shared that he wants to "work on not doing drug but also my mental health". Pt had good insight on how his mental health is part of the reason he uses substances. Pt shared [facility]". Pt will be transferred to the [facility] unit today. An inpatient level of care is needed to continue to work on mental health and coordinate a safe discharge    LOS:  11  Expected LOS: 15 days     Ins[facility] info[facility]prescription [facility]  [facility]  Date of last family contact:  8.18.23  Family requesting physician contact today:  No  Discharge plan:  to step down to the [facility] in the home:  No   [facility]    Participating treatment team members: Cooper Render, * (assigned SW), Janalyn Harder, LMSW

## 2022-05-11 NOTE — Group Note (Signed)
Group Therapy Note    Date: 05/11/2022    Group Start Time: 1740  Group End Time: 1840  Group Topic: Recreational    SSR 2 BH NON ACUTE    Laurita Quint        Group Therapy Note    Facilitated leisure skills group to reinforce positive coping and to manage mood through music, social interaction, group activities and art task     Attendees: 7/10       Patient's Goal:  Client will learn and demonstrate effective coping skills    Notes:  Pt was receptive to listening to music and songs he selected. Interacted with peers and staff. Declined to work on leisure task     Status After Intervention:  Improved    Participation Level: Active Listener and Interactive    Participation Quality: Appropriate      Speech:  normal      Thought Process/Content: Logical      Affective Functioning: Congruent      Mood:  Calm      Level of consciousness:  Attentive      Response to Learning: Progressing to goal      Endings: None Reported    Modes of Intervention: Socialization and Activity      Discipline Responsible: Recreational Therapist      Signature:  Laurita Quint, [address]

## 2022-05-11 NOTE — Behavioral Health Treatment Team (Signed)
READMIT NOTE    Pt admitted to medical floor due to [facility] 04/29/22. Pt admitted under the professional services of Dr Lujean Amel, pt has a history of PTSD, mood disorder, and polysubstance abuse UDS positive for cocaine and THC. Pt reports past medical history of HTN. Pt came into hospital after he was left in [place] with friends he was riding with from [place]. Pt had a recent discharge at the [facility] hospital in [place]. Pt came in with HI towards one of the friends with him in the car known as "peaches". Pt now denies HI. Pt endorsed SI on admission now denies SI, depression and anxiety. Pt denies AVH. Pt uses walker due to a limp he has from a past knee replacement. Pt calm and cooperative throughout admission. Pt pleasant and has been med compliant on medical floor. Searched completed with two staff members. Valuables obtained. Dr Lujean Amel notified for admission orders. Close observations ordered to ensure pt safety.

## 2022-05-11 NOTE — Progress Notes (Signed)
Report called to BHU

## 2022-05-11 NOTE — Discharge Summary (Signed)
[facility]  DISCHARGE SUMMARY    Name:  Andrew Hester, Andrew Hester  MR#:  [id number]  DOB:  Apr 28, 1961  ACCOUNT #:  [id number]  ADMIT DATE:  04/29/2022  DISCHARGE DATE:  05/11/2022    Please make reference to my initial psychiatric H and P.    This is a 61 year old single African American male patient, a veteran, admitted originally to the ED with knee pain, but then he expressed suicidal thoughts.  Apparently, he was at [facility] for one week and discharged.  He and his friends took off , but then the friends left him , took his clothes, medication, his money.  He felt tricked, cheated, no place to go, homeless, depressed, suicidal.  He was also using cocaine.  He has a history of PTSD, ADHD, depression.    ALLERGIES TO MEDICATIONS:  CONTRAST MEDIA.    REVIEW OF SYSTEMS:  Knee pain.    SUBSTANCE ABUSE:  Positive for cocaine, heroin.    TRAUMA HISTORY:  None.    Even though he was a little bit irritable, polite with me, free of psychosis, no hallucination.  A brief homicidal thought towards the people that tricked him first, did not hold on to it, he relinquished them.  The patient was admitted to the medical floor under psychiatric services under my service as he was COVID positive.  Kept on isolation precautions, treated with antidepressant medication, Zoloft, individual therapy, group therapy.  He was recognized that he has a problem with cocaine and drugs prior to getting into substance abuse rehab.  Apparently, [place] system also tried to do it, but then there are a lot of administrative hurdles one has to go through.  Apparently, as he was in [place], he has to go through one of the [place] [facility] in [place] and paper work was provided by our Sports coach.  They are working on it.  The patient is not suicidal, not homicidal, not psychotic, and he is also past the incubation period for COVID with COVID negative.  He is being transferred to the behavioral health unit for  further stabilization, treatment, disposition, getting into substance abuse rehab program.  As he had a lab work done recently, I would not repeat it.    DISCHARGE DIAGNOSES:  Mood disorder, acute without psychosis, history of attention deficit disorder, polysubstance abuse, COVID positive, now it is negative, posttraumatic stress disorder.    We will transfer him to the inpatient behavioral health unit for further care and treatment.  Continue amlodipine 2.5 mg daily, prazosin 2 mg at night, sertraline 50 mg daily, p.r.n. Tylenol.  Continue individual therapy, group therapy, but also plans to get him into inpatient substance abuse rehabilitation program.        Karilyn Cota, MD      [id number]  D:  05/12/2022 21:29  T:  05/13/2022 10:57  JOB #:  [id number]

## 2022-05-12 NOTE — Group Note (Addendum)
Group Therapy Note    Date: 05/12/2022    Group Start Time: 0930  Group End Time: 1045  Group Topic: Education Group - Inpatient    SSR 2 BH NON ACUTE    Mare Loan        Group Therapy Note    Attendees: 7/11    Facilitated structured group to increase awareness of triggers and encourage and to explore places, people and situations that cause triggers and positive ways to manage triggers.        Notes:  Did not attend group despite encouragement  Discipline Responsible: Recreational Therapist      Signature:  Mare Loan, CTRS

## 2022-05-12 NOTE — Group Note (Signed)
Group Therapy Note    Date: 05/12/2022    Group Start Time: 1730  Group End Time: 1820  Group Topic: Recreational    SSR 2 BH NON ACUTE    Mare Loan        Group Therapy Note    Attendees: 9/12    Recreational Therapist facilitated structured leisure skills group to introduce healthy leisure skills as positive way to cope and manage mood.         Patient's Goal:  Client will learn and demonstrate effective coping skills    Notes:  Attended group and selected songs to listen to.  Receptive to intervention and responded to prompts in group. Appropriately responded to prompts from C.T.R.S.     Status After Intervention:  Improved    Participation Level: Active Listener    Participation Quality: Appropriate      Speech:  normal      Thought Process/Content: Logical      Affective Functioning: Congruent      Mood:  calm       Level of consciousness:  Attentive      Response to Learning: Progressing to goal      Endings: None Reported    Modes of Intervention: Activity      Discipline Responsible: Recreational Therapist      Signature:  Mare Loan, CTRS

## 2022-05-12 NOTE — Group Note (Signed)
Group Therapy Note    Date: 05/12/2022    Group Start Time: 1315  Group End Time: 1400  Group Topic: Relaxation    SSR 2 BH NON ACUTE    Mare Loan        Group Therapy Note    Attendees: 9/13    Facilitated structured relaxation group as positive way to cope and manage mood        Patient's Goal:   Client will learn and demonstrate effective coping skills    Notes:  Attended group and actively participated. Was receptive to intervention. Verbalized group was positive way to relax. Focused on relaxation intervention during group.     Status After Intervention:  Improved    Participation Level: Active Listener    Participation Quality: Appropriate      Speech:  normal      Thought Process/Content: Logical      Affective Functioning: Congruent      Mood:  calm       Level of consciousness:  Alert      Response to Learning: Progressing to goal      Endings: None Reported    Modes of Intervention: Activity      Discipline Responsible: Recreational Therapist      Signature:  Mare Loan, CTRS

## 2022-05-12 NOTE — Behavioral Health Treatment Team (Signed)
Pt alert and oriented to all spheres. Affect flat, mood WNL. Pt accepted and tolerated meds as ordered. Pt stated he is leaving next week to go to [place] for treatment, after being abandoned in [place] by his girlfriend. Pt remains on fall precautions, using walker to ambulate without falls or injury. Pt denied thoughts of self harm or harm to others. Pt denied denied hallucinations and delusions.

## 2022-05-13 NOTE — Group Note (Signed)
Group Therapy Note    Date: 05/13/2022    Group Start Time: 0930  Group End Time: 1015  Group Topic: Education Group - Inpatient    SSR 2 BH NON ACUTE    Mare Loan        Group Therapy Note    Attendees: 6/13    Facilitated structured stress management group to assist participant by affirming the qualities, life experiences and coping skills that strengthen and protect from negative stress.      Notes:  Did not attend group despite encouragement      Discipline Responsible: Recreational Therapist      Signature:  Mare Loan, CTRS

## 2022-05-13 NOTE — Group Note (Signed)
Group Therapy Note    Date: 05/13/2022    Group Start Time: 1315  Group End Time: 1400  Group Topic: Recreational    SSR 2 BH NON ACUTE    Mare Loan        Group Therapy Note    Attendees: 6/13    Recreational Therapist facilitated structured leisure skills group to introduce healthy leisure skills as positive way to cope and manage mood.       Patient's Goal:   Client will learn and demonstrate effective coping skills    Notes:Attended group and listened to songs with peers.  Was receptive to intervention and responded to prompts from staff. Attended entire session and was attentive during group.      Status After Intervention:  Improved    Participation Level: Active Listener    Participation Quality: Appropriate      Speech:  normal      Thought Process/Content: Logical      Affective Functioning: Congruent      Mood:  calm       Level of consciousness:  Alert      Response to Learning: Progressing to goal      Endings: None Reported    Modes of Intervention: Activity      Discipline Responsible: Recreational Therapist      Signature:  Mare Loan, CTRS

## 2022-05-13 NOTE — Behavioral Health Treatment Team (Signed)
Pt noted in bed resting during intervals, up for snacks and medications, presented with a bright affect, smiling and talking with staff. Pt was ambulating with walker, denies any pain or discomfort, denies any SI/HI,AH/VH,pt is compliant with medication and denies any feelings of depression or anxiety at this . Pt remains on close observation for safety.

## 2022-05-13 NOTE — Behavioral Health Treatment Team (Signed)
Pt.is up and ambulating on unit with use of walker,affect is flat, appetite good, appearance is appropriate, denies feeling suicidal,pt.is pleasant upon approach, pt.is awaiting long term rehab thru the [place]. Denies hallucinations, no other concerns voiced,remains on close observation.

## 2022-05-13 NOTE — H&P (Unsigned)
[facility]  [facility] HISTORY AND PHYSICAL    Name:  Andrew Hester, Andrew Hester  MR#:  [id number]  DOB:  Oct 28, 1960  ACCOUNT #:  [id number]  ADMIT DATE:  05/11/2022    DATE SEEN:  05/12/2022    Please make reference to my initial psychiatric H and P that was done for 04/29/2022 admission and also discharge summary.    HISTORY OF PRESENT ILLNESS:  Basically, a 61 year old single veteran admitted to behavioral health unit as a discharge and new admission to the behavioral health unit.  He was originally admitted for depression, mood disorder, PTSD, ADHD, knee pain.  He also has cocaine abuse, and while he has the medical clearance under behavioral health service as he had COVID, the staff was working with him and the [place] system to get him into an inpatient substance abuse rehab at [facility] in [place], and it is taking some administrative decisions, moves to get him into a [employer], which the [place] system is working on.  However, he is free of COVID now.  He is being transferred from the medical floor to behavioral health unit for ongoing treatment of mood disorder and PTSD, substance abuse, ADHD.    DIAGNOSES:  Mood disorder, acute, without psychosis; history of attention deficit disorder; polysubstance abuse; COVID positive, now it is negative.  He is waiting to get into inpatient substance abuse rehabilitation.  Continue his present medications namely Zoloft 50 mg daily, amlodipine 2.5 mg daily, prazosin 2 mg at night, sertraline 50 mg, and p.r.n. hydroxyzine and trazodone.  Individual therapy, group therapy.    LENGTH OF STAY:  Three to five days.        Karilyn Cota, MD      [id number]  D:  05/12/2022 21:33  T:  05/13/2022 3:24  JOB #:  [id number]

## 2022-05-14 MED ORDER — IBUPROFEN 400 MG PO TABS
400 | Freq: Four times a day (QID) | ORAL | Status: DC | PRN
Start: 2022-05-14 — End: 2022-05-16

## 2022-05-14 MED ORDER — DICLOFENAC SODIUM 1 % EX GEL
1 | Freq: Two times a day (BID) | CUTANEOUS | Status: DC
Start: 2022-05-14 — End: 2022-05-16
  Administered 2022-05-15 – 2022-05-16 (×4): 2 g via TOPICAL

## 2022-05-14 MED FILL — DICLOFENAC SODIUM 1 % EX GEL: 1 % | CUTANEOUS | Qty: 100

## 2022-05-14 NOTE — Group Note (Signed)
Group Therapy Note    Date: 05/14/2022    Group Start Time: 1330  Group End Time: 1415  Group Topic: Recreational    SSR 2 BH NON ACUTE    Laurita Quint        Group Therapy Note    Facilitated leisure skills group to reinforce positive coping and to manage mood through music, social interaction, group activities and art task     Attendees: 8/13       Patient's Goal:  Client will learn and demonstrate effective coping skills    Notes: Pt was receptive to listening to music and a song he selected. Interacted with peers and staff. Declined to work on leisure task     Status After Intervention:  Improved    Participation Level: Active Listener and Interactive    Participation Quality: Appropriate      Speech:  normal      Thought Process/Content: Logical      Affective Functioning: Congruent      Mood:  Calm      Level of consciousness:  Alert      Response to Learning: Progressing to goal      Endings: None Reported    Modes of Intervention: Socialization and Activity      Discipline Responsible: Recreational Therapist      Signature:  Laurita Quint, [address]

## 2022-05-14 NOTE — Behavioral Health Treatment Team (Signed)
DAY SHIFT    Pt up ad lib using walker. Pt eating meals in day room, socializing with peers, and attending groups. Pt presents with a bright affect, calm and cooperative and takes medication with out difficulty. Pt denies depression but reports "some anxiety about my discharge." Pt trying to get into [place] hospital. Pt denies SI/HI. Pt denies AVH.  Close observations continued to ensure pt safety.

## 2022-05-14 NOTE — Consults (Signed)
Hospitalist Consultation Note    NAME:  Andrew Hester   DOB:   05-09-1961   MRN:   [id number]     ATTENDING: Laray Anger, MD  PCP:  None None (Inactive)    Date/Time:  05/14/2022 9:50 AM      Recommendations/Plan:       Principal Problem:    Mood disorder (HCC)  Resolved Problems:    * No resolved hospital problems. *  Hypertension  Left knee arthritis    Place patient on diclofenac 1% as needed ibuprofen    Code Status:   DVT Prophylaxis: Patient is ambulatory          Subjective:   REQUESTING PHYSICIAN:  REASON FOR CONSULT:      Andrew Hester is a 61 y.o.  African American male who I was asked to see for inpatient evaluation for medical treatment.  Patient complains of left knee pain he had surgery on his left knee in the past history hypertension            Past Medical History:   Diagnosis Date    Hypertension       Past Surgical History:   Procedure Laterality Date    JOINT REPLACEMENT Left      Social History     Tobacco Use    Smoking status: Every Day     Types: Cigarettes    Smokeless tobacco: Never   Substance Use Topics    Alcohol use: Not on file      No family history on file.    Allergies   Allergen Reactions    Contrast [Iodides] Nausea And Vomiting      Prior to Admission medications    Medication Sig Start Date End Date Taking? Authorizing Provider   amLODIPine (NORVASC) 2.5 MG tablet Take 1 tablet by mouth daily   Yes Historical Provider, MD   sertraline (ZOLOFT) 50 MG tablet Take 1 tablet by mouth daily   Yes Historical Provider, MD       REVIEW OF SYSTEMS:     Total of 12 systems reviewed as follows:   I am not able to complete the review of systems because:   The patient is intubated and sedated    The patient has altered mental status due to his acute medical problems    The patient has baseline aphasia from prior stroke(s)    The patient has baseline dementia and is not reliable historian                 POSITIVE= underlined text  Negative = text not underlined  General:  fever,  chills, sweats, generalized weakness, weight loss/gain,      loss of appetite   Eyes:    blurred vision, eye pain, loss of vision, double vision  ENT:    rhinorrhea, pharyngitis   Respiratory:   cough, sputum production, SOB, wheezing, DOE, pleuritic pain   Cardiology:   chest pain, palpitations, orthopnea, PND, edema, syncope   Gastrointestinal:  abdominal pain , N/V, dysphagia, diarrhea, constipation, bleeding   Genitourinary:  frequency, urgency, dysuria, hematuria, incontinence   Muskuloskeletal :  arthralgia, myalgia   Hematology:  easy bruising, nose or gum bleeding, lymphadenopathy   Dermatological: rash, ulceration, pruritis   Endocrine:   hot flashes or polydipsia   Neurological:  headache, dizziness, confusion, focal weakness, paresthesia,     Speech difficulties, memory loss, gait disturbance  Psychological: Feelings of anxiety, depression, agitation    Objective:   VITALS:  BP 119/73   Pulse 62   Temp 98.1 F (36.7 C) (Oral)   Resp 17   Ht 1.778 m (5\' 10" )   Wt 83.9 kg (185 lb)   SpO2 99%   BMI 26.54 kg/m   Temp (24hrs), Avg:98.1 F (36.7 C), Min:98.1 F (36.7 C), Max:98.1 F (36.7 C)      PHYSICAL EXAM:   General:    Alert, cooperative, no distress, appears stated age.     HEENT: Atraumatic, anicteric sclerae, pink conjunctivae     No oral ulcers, mucosa moist, throat clear  Neck:  Supple, symmetrical,  thyroid: non tender  Lungs:   Clear to auscultation bilaterally.  No Wheezing or Rhonchi. No rales.  Chest wall:  No tenderness  No Accessory muscle use.  Heart:   Regular  rhythm,  No  murmur   No edema  Abdomen:   Soft, non-tender. Not distended.  Bowel sounds normal  Extremities: No cyanosis.  No clubbing  Skin:     Not pale.  Not Jaundiced  No rashes   Psych:  Good insight.  Not depressed.  Not anxious or agitated.  Neurologic: EOMs intact. No facial asymmetry. No aphasia or slurred speech. Symmetrical strength, Alert and oriented X 4.      _______________________________________________________________________  Care Plan discussed with:    Comments   Patient     Family      RN     Care Manager                    Consultant:      ____________________________________________________________________  TOTAL TIME:     30 mins    Comments     Reviewed previous records   >50% of visit spent in counseling and coordination of care  Discussion with patient and/or family and questions answered       Critical Care Provided     Minutes non procedure based  ________________________________________________________________________  Signed: , MD      Procedures: see electronic medical records for all procedures/Xrays and details which were not copied into this note but were reviewed prior to creation of Plan.    LAB DATA REVIEWED:    No results found for this or any previous visit (from the past 24 hour(s)).    _____________________________  Hospitalist: Delma Officer, MD

## 2022-05-14 NOTE — Behavioral Health Treatment Team (Signed)
Pt noted in bed most of the shift, up for snacks only,flat affect, denies any SI/HI,AH/VH, compliant with medications, denies any pain or discomfort at this time, pt remain on close observation for safety

## 2022-05-14 NOTE — Behavioral Health Treatment Team (Signed)
Nurse Note:    Patient observed in the hallway ambulating with walker with steady gait. Denies SI, HI, A/V hallucinations. Denies anxiety and depression. C/o left knee pain 8/10 and is requesting a knee brace; writer explained that medical equipment with metal are not allowed on this unit. Patient is requesting to have a soft brace placed to the left knee. Patient is medication compliant; received Prn Tylenol. Tylenol is effective with no further c/o pain. Observed resting quietly with eyes closed; will continue to monitor for safety.

## 2022-05-14 NOTE — Progress Notes (Signed)
PSYCHIATRIC PROGRESS NOTE         Patient Name  Andrew Hester   Date of Birth February 25, 1961   CSN [id number]   Medical Record Number  [id number]      Age  61 y.o.   PCP None None (Inactive)   Admit date:  05/11/2022    Room Number  234/01   [facility]   Date of Service  05/14/2022            HISTORY OF PRESENT ILLNESS/INTERVAL HISTORY:              MENTAL STATUS EXAM & VITALS                    VITALS:     Patient Vitals for the past 24 hrs:   Temp Pulse Resp BP   05/14/22 0928 98 F (36.7 C) 70 18 119/73     Wt Readings from Last 3 Encounters:   05/11/22 83.9 kg (185 lb)   04/29/22 74.8 kg (165 lb)     Temp Readings from Last 3 Encounters:   05/14/22 98 F (36.7 C) (Oral)   05/11/22 99.3 F (37.4 C) (Oral)     BP Readings from Last 3 Encounters:   05/14/22 119/73   05/11/22 105/68     Pulse Readings from Last 3 Encounters:   05/14/22 70   05/11/22 56            DATA     LABORATORY DATA:(reviewed/updated 05/14/2022)  No results found for this or any previous visit (from the past 24 hour(s)).   No results found for: VALAC, VALP, CARB2  No results found for: LITHM   RADIOLOGY REPORTS:(reviewed/updated 05/14/2022)  XR KNEE LEFT (3 VIEWS)    Result Date: 04/29/2022  EXAM: XR KNEE LEFT (3 VIEWS) INDICATION: pain. COMPARISON: None. FINDINGS: Three views of the left knee demonstrate no fracture or other acute osseous or articular abnormality. 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     No acute abnormality.           MEDICATIONS     ALL MEDICATIONS:   Current Facility-Administered Medications   Medication Dose Route Frequency    diclofenac sodium (VOLTAREN) 1 % gel 2 g  2 g Topical BID    ibuprofen (ADVIL;MOTRIN) tablet 400 mg  400 mg Oral Q6H PRN    amLODIPine (NORVASC) tablet 2.5 mg  2.5 mg Oral Daily    prazosin (MINIPRESS) capsule 2 mg  2 mg Oral Nightly    sertraline (ZOLOFT) tablet 50 mg  50 mg Oral Daily    acetaminophen (TYLENOL) tablet 650 mg  650 mg Oral Q4H PRN    hydrOXYzine HCl (ATARAX) tablet  50 mg  50 mg Oral TID PRN    traZODone (DESYREL) tablet 50 mg  50 mg Oral Nightly PRN    magnesium hydroxide (MILK OF MAGNESIA) 400 MG/5ML suspension 30 mL  30 mL Oral Daily PRN    aluminum & magnesium hydroxide-simethicone (MAALOX) 200-200-20 MG/5ML suspension 30 mL  30 mL Oral Q6H PRN      SCHEDULED MEDICATIONS:    diclofenac sodium  2 g Topical BID    amLODIPine  2.5 mg Oral Daily    prazosin  2 mg Oral Nightly    sertraline  50 mg Oral Daily           ASSESSMENT & PLAN   Progress note for May 14, 2022 90 patient seen for follow-up chart reviewed  case discussed in the treatment team with the team and case manager [facility]    Placement in a substance abuse inpatient rehabilitation program patient rating patient alert and oriented engaging thank you  Continue close observation  Discussed meds  Provided support, psycho edu  Discussed with staff in the treatment team, the progress made in therapy, psychosocial needs and nursing concerns.    The following regarding medications was addressed during rounds with patient:   the risks and benefits of the proposed medication.   The patient was given the opportunity to ask questions.   Informed consent given to the use of the above medications.     Obtain psychiatric records from previous psych hospitals to further elucidate the nature of patient's psychopathology and review once available.    Gather additional collateral information from friends, family and o/p treatment team to further elucidate the nature of patient's psychopathology and baselline level of psychiatric functioning.         I certify that this patient's inpatient psychiatric hospital services continue to be, required for treatment that could reasonably be expected to improve the patient's condition and that the patient continues to need, on a daily basis, active treatment furnished directly by or requiring the supervision of inpatient psychiatric  facility personnel. In addition, the hospital records show that services furnished were intensive treatment services.    Signed By:   Laray Anger, MD  05/14/2022

## 2022-05-14 NOTE — Behavioral Health Treatment Team (Addendum)
Behavioral Health Treatment Team Note     Patient goal(s) for today: "to do this intake man"  Treatment team focus/goals: continue medication management, group therapy, complete [facility] intake and provide a safe discharge    Progress note: Pt presented with a broad, bright affect and "anxious" mood. Pt denied any SI/HI/Avh at the time and orientated x4. Pt was well groomed and pleasant to speak with. Writer inform the pt that she has reached out to [place] and left a VM for her to call her back. Pt shared that he appreciates "all the help". Writer will continue to reach out to the [place] to coordinate a safe discharge due to the high risk of relapse.  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

## 2022-05-14 NOTE — Progress Notes (Signed)
PSYCHIATRIC PROGRESS NOTE         Patient Name  Andrew Hester   Date of Birth 16-Mar-1961   CSN [id number]   Medical Record Number  [id number]      Age  61 y.o.   PCP None None (Inactive)   Admit date:  05/11/2022    Room Number  234/01   [facility]   Date of Service  05/14/2022            HISTORY OF PRESENT ILLNESS/INTERVAL HISTORY:              MENTAL STATUS EXAM & VITALS                    VITALS:     Patient Vitals for the past 24 hrs:   Temp Pulse Resp BP   05/14/22 0928 98 F (36.7 C) 70 18 119/73     Wt Readings from Last 3 Encounters:   05/11/22 83.9 kg (185 lb)   04/29/22 74.8 kg (165 lb)     Temp Readings from Last 3 Encounters:   05/14/22 98 F (36.7 C) (Oral)   05/11/22 99.3 F (37.4 C) (Oral)     BP Readings from Last 3 Encounters:   05/14/22 119/73   05/11/22 105/68     Pulse Readings from Last 3 Encounters:   05/14/22 70   05/11/22 56            DATA     LABORATORY DATA:(reviewed/updated 05/14/2022)  No results found for this or any previous visit (from the past 24 hour(s)).   No results found for: VALAC, VALP, CARB2  No results found for: LITHM   RADIOLOGY REPORTS:(reviewed/updated 05/14/2022)  XR KNEE LEFT (3 VIEWS)    Result Date: 04/29/2022  EXAM: XR KNEE LEFT (3 VIEWS) INDICATION: pain. COMPARISON: None. FINDINGS: Three views of the left knee demonstrate no fracture or other acute osseous or articular abnormality. 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     No acute abnormality.           MEDICATIONS     ALL MEDICATIONS:   Current Facility-Administered Medications   Medication Dose Route Frequency    diclofenac sodium (VOLTAREN) 1 % gel 2 g  2 g Topical BID    ibuprofen (ADVIL;MOTRIN) tablet 400 mg  400 mg Oral Q6H PRN    amLODIPine (NORVASC) tablet 2.5 mg  2.5 mg Oral Daily    prazosin (MINIPRESS) capsule 2 mg  2 mg Oral Nightly    sertraline (ZOLOFT) tablet 50 mg  50 mg Oral Daily    acetaminophen (TYLENOL) tablet 650 mg  650 mg Oral Q4H PRN    hydrOXYzine HCl (ATARAX) tablet  50 mg  50 mg Oral TID PRN    traZODone (DESYREL) tablet 50 mg  50 mg Oral Nightly PRN    magnesium hydroxide (MILK OF MAGNESIA) 400 MG/5ML suspension 30 mL  30 mL Oral Daily PRN    aluminum & magnesium hydroxide-simethicone (MAALOX) 200-200-20 MG/5ML suspension 30 mL  30 mL Oral Q6H PRN      SCHEDULED MEDICATIONS:    diclofenac sodium  2 g Topical BID    amLODIPine  2.5 mg Oral Daily    prazosin  2 mg Oral Nightly    sertraline  50 mg Oral Daily           ASSESSMENT & PLAN     Continue close observation  Discussed meds  Provided support, psycho edu  Discussed with staff in the treatment team, the progress made in therapy, psychosocial needs and nursing concerns.    The following regarding medications was addressed during rounds with patient:   the risks and benefits of the proposed medication.   The patient was given the opportunity to ask questions.   Informed consent given to the use of the above medications.     Obtain psychiatric records from previous psych hospitals to further elucidate the nature of patient's psychopathology and review once available.    Gather additional collateral information from friends, family and o/p treatment team to further elucidate the nature of patient's psychopathology and baselline level of psychiatric functioning.         I certify that this patient's inpatient psychiatric hospital services continue to be, required for treatment that could reasonably be expected to improve the patient's condition and that the patient continues to need, on a daily basis, active treatment furnished directly by or requiring the supervision of inpatient psychiatric facility personnel. In addition, the hospital records show that services furnished were intensive treatment services.    Signed By:   Laray Anger, MD  05/14/2022

## 2022-05-14 NOTE — Group Note (Signed)
Group Therapy Note    Date: 05/14/2022    Group Start Time: 0935  Group End Time: 1015  Group Topic: Education Group - Inpatient    SSR 2 BH NON ACUTE    Laurita Quint        Group Therapy Note    Facilitated discussion focused on defining and recognizing examples of different types of cognitive distortions and how they affect moods and behaviors     Attendees: 13/14       Patient's Goal:  Client will learn and demonstrate effective coping skills    Notes:  Receptive to information discussed and engaged. Was able to recognize and acknowledged engaging in  some of them in the past and prior to admission    Status After Intervention:  Improved    Participation Level: Active Listener and Interactive    Participation Quality: Appropriate, Attentive, and Sharing      Speech:  normal      Thought Process/Content: Logical      Affective Functioning: Congruent      Mood:  Calm      Level of consciousness:  Attentive      Response to Learning: Able to verbalize current knowledge/experience and Progressing to goal      Endings: None Reported    Modes of Intervention: Education and Support      Discipline Responsible: Recreational Therapist      Signature:  Laurita Quint, [address]
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
## 2022-05-14 NOTE — Group Note (Signed)
Group Therapy Note    Date: 05/14/2022    Group Start Time: 1115  Group End Time: 1200  Group Topic: Process Group - Inpatient    SSR 2 [facility] ACUTE    Catherine Cubero        Group Therapy Note: This writer facilitated a group on short term and long term goals after discharge.    Attendees: 5       Patient's Goal:  to attend groups    Notes:  pt was encouraged to attend but did not      Signature:  Mazella Deen

## 2022-05-15 MED ORDER — TRAZODONE HCL 50 MG PO TABS
50 MG | ORAL_TABLET | Freq: Every evening | ORAL | 0 refills | Status: AC | PRN
Start: 2022-05-15 — End: ?

## 2022-05-15 MED ORDER — AMLODIPINE BESYLATE 2.5 MG PO TABS
2.5 MG | ORAL_TABLET | Freq: Every day | ORAL | 3 refills | Status: AC
Start: 2022-05-15 — End: ?

## 2022-05-15 MED ORDER — PRAZOSIN HCL 2 MG PO CAPS
2 MG | ORAL_CAPSULE | Freq: Every evening | ORAL | 3 refills | Status: AC
Start: 2022-05-15 — End: ?

## 2022-05-15 MED ORDER — IBUPROFEN 400 MG PO TABS
400 MG | ORAL_TABLET | Freq: Four times a day (QID) | ORAL | 0 refills | Status: AC | PRN
Start: 2022-05-15 — End: 2022-05-25

## 2022-05-15 MED ORDER — SERTRALINE HCL 50 MG PO TABS
50 MG | ORAL_TABLET | Freq: Every day | ORAL | 0 refills | Status: AC
Start: 2022-05-15 — End: ?

## 2022-05-15 MED ORDER — DICLOFENAC SODIUM 1 % EX GEL
1 % | Freq: Two times a day (BID) | CUTANEOUS | 0 refills | Status: AC
Start: 2022-05-15 — End: ?

## 2022-05-15 NOTE — Group Note (Signed)
Group Therapy Note    Date: 05/15/2022    Group Start Time: 1845  Group End Time: 1930  Group Topic: Recreational    SSR 2 BH NON ACUTE    Mare Loan        Group Therapy Note    Attendees: 8/11    Recreational Therapist facilitated structured leisure skills group to introduce healthy leisure skills as positive way to cope and manage mood.         Patient's Goal:  Client will learn and demonstrate effective coping skills    Notes:   Attended group. Listened to songs played in group. Was cooperative and receptive to intervention. Responded to staff with cues/prompts and encouragement to participate.     Status After Intervention:  Improved    Participation Level: Active Listener    Participation Quality: Appropriate      Speech:  normal      Thought Process/Content: Logical      Affective Functioning: Congruent      Mood:  calm      Level of consciousness:  Attentive      Response to Learning: Progressing to goal      Endings: None Reported    Modes of Intervention: Activity      Discipline Responsible: Recreational Therapist      Signature:  Mare Loan, CTRS

## 2022-05-15 NOTE — Progress Notes (Signed)
Vitals:    05/15/22 0832   BP: 105/64   Pulse:    Resp:    Temp:    SpO2:        Current Facility-Administered Medications:     diclofenac sodium (VOLTAREN) 1 % gel 2 g, 2 g, Topical, BID, Blake Divine, MD, 2 g at 05/15/22 1610    ibuprofen (ADVIL;MOTRIN) tablet 400 mg, 400 mg, Oral, Q6H PRN, Blake Divine, MD    amLODIPine (NORVASC) tablet 2.5 mg, 2.5 mg, Oral, Daily, Laray Anger, MD, 2.5 mg at 05/15/22 9604    prazosin (MINIPRESS) capsule 2 mg, 2 mg, Oral, Nightly, Laray Anger, MD, 2 mg at 05/14/22 2108    sertraline (ZOLOFT) tablet 50 mg, 50 mg, Oral, Daily, Laray Anger, MD, 50 mg at 05/15/22 5409    acetaminophen (TYLENOL) tablet 650 mg, 650 mg, Oral, Q4H PRN, Laray Anger, MD, 650 mg at 05/14/22 2108    hydrOXYzine HCl (ATARAX) tablet 50 mg, 50 mg, Oral, TID PRN, Laray Anger, MD, 50 mg at 05/13/22 1606    traZODone (DESYREL) tablet 50 mg, 50 mg, Oral, Nightly PRN, Laray Anger, MD, 50 mg at 05/13/22 2113    magnesium hydroxide (MILK OF MAGNESIA) 400 MG/5ML suspension 30 mL, 30 mL, Oral, Daily PRN, Laray Anger, MD    aluminum & magnesium hydroxide-simethicone (MAALOX) 200-200-20 MG/5ML suspension 30 mL, 30 mL, Oral, Q6H PRN, Laray Anger, MD  Patient Active Problem List   Diagnosis    COVID    Suicidal ideation    Mood disorder (HCC)

## 2022-05-15 NOTE — Progress Notes (Signed)
PSYCHIATRIC PROGRESS NOTE         Patient Name  Andrew Hester   Date of Birth 31-Jul-1961   CSN [id number]   Medical Record Number  [id number]      Age  61 y.o.   PCP None None (Inactive)   Admit date:  05/11/2022    Room Number  234/01   [facility]   Date of Service  05/15/2022            HISTORY OF PRESENT ILLNESS/INTERVAL HISTORY:              MENTAL STATUS EXAM & VITALS                    VITALS:     Patient Vitals for the past 24 hrs:   Temp Pulse Resp BP SpO2   05/15/22 2123 98.4 F (36.9 C) 57 18 119/76 --   05/15/22 0832 -- -- -- 105/64 --   05/15/22 0757 98.1 F (36.7 C) 53 18 105/64 100 %     Wt Readings from Last 3 Encounters:   05/11/22 83.9 kg (185 lb)   04/29/22 74.8 kg (165 lb)     Temp Readings from Last 3 Encounters:   05/15/22 98.4 F (36.9 C) (Oral)   05/11/22 99.3 F (37.4 C) (Oral)     BP Readings from Last 3 Encounters:   05/15/22 119/76   05/11/22 105/68     Pulse Readings from Last 3 Encounters:   05/15/22 57   05/11/22 56            DATA     LABORATORY DATA:(reviewed/updated 05/15/2022)  No results found for this or any previous visit (from the past 24 hour(s)).   No results found for: VALAC, VALP, CARB2  No results found for: LITHM   RADIOLOGY REPORTS:(reviewed/updated 05/15/2022)  XR KNEE LEFT (3 VIEWS)    Result Date: 04/29/2022  EXAM: XR KNEE LEFT (3 VIEWS) INDICATION: pain. COMPARISON: None. FINDINGS: Three views of the left knee demonstrate no fracture or other acute osseous or articular abnormality. 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     No acute abnormality.           MEDICATIONS     ALL MEDICATIONS:   Current Facility-Administered Medications   Medication Dose Route Frequency    diclofenac sodium (VOLTAREN) 1 % gel 2 g  2 g Topical BID    ibuprofen (ADVIL;MOTRIN) tablet 400 mg  400 mg Oral Q6H PRN    amLODIPine (NORVASC) tablet 2.5 mg  2.5 mg Oral Daily    prazosin (MINIPRESS) capsule 2 mg  2 mg Oral Nightly    sertraline (ZOLOFT) tablet 50 mg  50 mg Oral  Daily    acetaminophen (TYLENOL) tablet 650 mg  650 mg Oral Q4H PRN    hydrOXYzine HCl (ATARAX) tablet 50 mg  50 mg Oral TID PRN    traZODone (DESYREL) tablet 50 mg  50 mg Oral Nightly PRN    magnesium hydroxide (MILK OF MAGNESIA) 400 MG/5ML suspension 30 mL  30 mL Oral Daily PRN    aluminum & magnesium hydroxide-simethicone (MAALOX) 200-200-20 MG/5ML suspension 30 mL  30 mL Oral Q6H PRN      SCHEDULED MEDICATIONS:    diclofenac sodium  2 g Topical BID    amLODIPine  2.5 mg Oral Daily    prazosin  2 mg Oral Nightly    sertraline  50 mg Oral Daily  ASSESSMENT & PLAN   Progress note for May 15, 2022 patient seen for follow-up chart reviewed discussed in the treatment team patient remains alert and oriented in all 3 spheres polite cooperative    Good energy level patient's focused on going to substance abuse inpatient rehab program late in the day as informed by the case manager that he will be leaving to [facility] tomorrow morning prescription electronically sent to I will not have him seen for medication to be supplied at the time of discharge continued inpatient level of care indicated.  Discharge plan  Not suicidal or homicidal psychosis      I certify that this patient's inpatient psychiatric hospital services continue to be, required for treatment that could reasonably be expected to improve the patient's condition and that the patient continues to need, on a daily basis, active treatment furnished directly by or requiring the supervision of inpatient psychiatric facility personnel. In addition, the hospital records show that services furnished were intensive treatment services.    Signed By:   Laray Anger, MD  05/15/2022

## 2022-05-15 NOTE — Plan of Care (Addendum)
Problem: Pain  Goal: Verbalizes/displays adequate comfort level or baseline comfort level  05/15/2022 2221 by Danie Chandler, RN  Outcome: Progressing  05/15/2022 0841 by Cherie Ouch, RN  Outcome: Progressing     Problem: Safety - Adult  Goal: Free from fall injury  Outcome: Progressing    -no c/o pain or discomfort.  -pt ambulates with a walker without difficulty.      Pt ambulates with a walker. He was standing in the hallway with one of his legs raised over the walker. Pt informed that he should not stand with his leg supported on the walker. The pt was compliant. He rated his anxiety a 7/10 and denied having any depression. Pt denies having any SI or hallucinations. He has been pleasant and cooperative. His affect is WNLs. The pt has been in the milieu watching T.V. and interacting with his peers. He accepted snacks and something to drink. The pt remains A+O and is med compliant. No prn medication requested. Close observation maintained for safety.         Pt c/o being constipated. Pt given prn MOM, an apple, and apple juice.

## 2022-05-15 NOTE — Behavioral Health Treatment Team (Signed)
DAYSHIFT NOTE:     Patient up this morning and walking around the unit using his walker. Patient has a steady gait with using walker. Patient is pleasant and polite on approach. Patient did verbalize having some anxiety this morning rating 8/10. Denies having any depression. Denies SI/HI. Patient is medication compliant and received voltaren gel for his left knee. Patient is up for his meals and snacks and is noted to be on the phone at times throughout the day.     [facility] Patient was allowed to sit in the unit office and talk with [facility] with staff supervision.     Patient thankful and noted to rest in his room at times. Close observations continued to ensure patient safety.

## 2022-05-15 NOTE — Behavioral Health Treatment Team (Signed)
Behavioral Health Treatment Team Note     Patient goal(s) for today:   Treatment team focus/goals: continue medication management, group therapy and provide a safe discharge    Progress note: Pt presented with a bright affect and "good" mood. Pt denied any SI/HI/AVH at the time and was orientated x4. Pt shared that he was "ready to get this treatment on the road". Writer shared with the pt that he the referral was sent up to the [facility] and they reached out to the [facility]. Pt appeared to be excited for the residential treatment. Writer spoke with Leonette Most [phone]) at the [facility] and he shared that they completed the intake and will be reaching out to the [facility] today to schedule discharge. Pt will need the medications delivered from [facility]. Writer reached out to the [facility] and they are coordinating the [facility] and will notify the Clinical research associate today.  An inpatient level of care is needed to coordinate a safe discharge for the pt.    LOS:  4  Expected LOS: 5-7 days    Insurance info/prescription [facility]:  [facility]  Date of last family contact:  8.22.23  Family requesting physician contact today:  No  Discharge plan:  to step down with the [facility] in the home:  No   [facility] [facility]    Participating treatment team members: Cooper Render, * (assigned SW), Janalyn Harder, LMSW

## 2022-05-15 NOTE — Group Note (Incomplete)
Group Therapy Note    Date: 05/15/2022    Group Start Time: 1115  Group End Time: 1200  Group Topic: Process Group - Inpatient    SSR 2 [facility] ACUTE    Andrew Hester, MSW        Group Therapy Note    Attendees: 10         Patient's Goal:  ***    Notes:  ***    Status After Intervention:  {Status After Intervention:304201511}    Participation Level: {Participation Level:304201512}    Participation Quality: {MH BHI PARTICIPATION QUALITY:304201513}      Speech:  {ED MH CD_SPEECH:18291}      Thought Process/Content: {Thought Process/Content:304201514}      Affective Functioning: {Affective Functioning:304201515}      Mood: {Mood:210450006}      Level of consciousness:  {Level of consciousness:304201516}      Response to Learning: {MH BHI Responses to Learning:304201521}      Endings: {MH BHI Endings:25257}    Modes of Intervention: {MH BHI Modes of Intervention:304201520}      Discipline Responsible: {MH BHI Multidisciplinary:304000567}      Signature:  Andrew Hester, MSW
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
## 2022-05-16 NOTE — Behavioral Health Treatment Team (Signed)
DAY SHIFT AND DISCHARGE    Pt up ad lib around unit and interacting with peers. Pt present with bright affect and cooperative. Pt denies depression and anxiety. Pt denies SI/HI. Pt denies AVH.   Discharge instructions reviewed and understood with the pt and pt verbalized understanding. Medications reviewed with the pt and pt verbalized understanding. Medications sent with pt from [facility]. Valuables returned to pt from safe and storage closet. Pt discharged via [place] transport without any complaints voiced at 1253.

## 2022-05-16 NOTE — Behavioral Health Treatment Team (Signed)
DISCHARGE SUMMARY    NAME:Chrishun Basey  DOB: [birth year]  MRN: [id number]    The patient Quindarius Cabello exhibits the ability to control behavior in a less restrictive environment.  Patient's level of functioning is improving.  No assaultive/destructive behavior has been observed for the past 24 hours.  No suicidal/homicidal threat or behavior has been observed for the past 24 hours.  There is no evidence of serious medication side effects.  Patient has not been in physical or protective restraints for at least the past 24 hours.    If weapons involved, how are they secured? N/a    Is patient aware of and in agreement with discharge plan? yes    Arrangements for medication:  Prescriptions will be with the pt    Copy of discharge instructions to provider?:  yes    Arrangements for transportation home:  Pt will be picked up by the staff from the [facility]    Keep all follow up appointments as scheduled, continue to take prescribed medications per physician instructions.  Mental health crisis number:  911 or your local mental health crisis line number at [phone]      Mental Health Emergency WARM LINE      1-866-400-MHAV [phone])      M-F: 9am to 9pm      Sat & Sun: 5pm - 9pm  National suicide prevention lines:                             1-800-SUICIDE (979)165-5882)       1-800-273-TALK (548)306-6383)   24/7 Crisis Text Line:  Text HOME to [phone]

## 2022-05-16 NOTE — Discharge Summary (Signed)
[facility]  DISCHARGE SUMMARY    Name:  Andrew Hester, Andrew Hester  MR#:  [id number]  DOB:  10/03/1960  ACCOUNT #:  [id number]  ADMIT DATE:  05/11/2022  DISCHARGE DATE:  05/16/2022    Please make reference to my initial psychiatric H and P for 05/11/2022 and 04/29/2022.    HISTORY OF PRESENT ILLNESS:  This is a veteran, who was admitted for depression and suicidal thought, polysubstance abuse, vaping.  Originally, he had COVID.  He was admitted under behavioral health services on the medical floor but then once COVID cleared, he was transferred to the behavioral health unit for further continuity of treatment on 05/11/2022.  Efforts were made by the case manager and the team to getting him into the [facility], an inpatient substance rehabilitation, coordinated by our case ma with the assistance of [place] system and the approval was accomplished for transfer to the [facility] today on 04/29/2022.  During the stay here, the patient remained very patient, polite, cooperative, worked with the staff in necessary treatment, medication management, personal hygiene, grooming.  Maintained good interpersonal behavior.  He was thankful for efforts being made for him to be getting into rehab program.  Not suicidal, not homicidal, not psychotic.  He is not COVID positive anymore.  He is being discharged.    No surgeries were done during his admission here.    LABORATORY DATA:  No new labs were done during the admission to behavioral health unit as they were already done in the ED and on the medical floor.    DISCHARGE DIAGNOSES:  Mood disorder, acute, without psychosis; history of attention-deficit hyperactivity disorder; polysubstance abuse, cocaine positive.    Discharged as improved to go to inpatient residential substance abuse program.    Medications were called into [facility] to be delivered for him to take with him when he goes to the program.    No surgeries were performed during this  hospitalization.    ALLERGIES TO MEDICATION:  CONTRAST MEDIA.    VITAL SIGNS:  On the day of discharge, BP 117/72, pulse 56, temperature 98.1, and respirations 18, height 5 feet 10 inches, weight 185 pounds, SPO2 of 100 on room air.    MEDICATION LIST:  1.  Diclofenac 1% gel apply twice a day.  2.  Ibuprofen 400 mg every six hours as needed for pain.  3.  Prazosin 2 mg at night for PTSD.  4.  Trazodone 50 mg p.r.n. for insomnia.  5.  Amlodipine 2.5 mg for high blood pressure.  6.  Sertraline 50 mg daily for depression, PTSD.    Discharged as improved.  Not suicidal, not homicidal.  Going into inpatient residential substance abuse program.        Karilyn Cota, MD      [id number]  D:  05/17/2022 0:06  T:  05/17/2022 21:06  JOB #:  [id number]
